# Patient Record
Sex: Female | Born: 1950
Health system: Southern US, Community
[De-identification: ages and names within clinical notes are randomized; demographics above are authoritative.]

## PROBLEM LIST (undated history)

## (undated) DIAGNOSIS — T7840XA Allergy, unspecified, initial encounter: Secondary | ICD-10-CM

## (undated) DIAGNOSIS — K219 Gastro-esophageal reflux disease without esophagitis: Secondary | ICD-10-CM

## (undated) DIAGNOSIS — I10 Essential (primary) hypertension: Secondary | ICD-10-CM

## (undated) DIAGNOSIS — E785 Hyperlipidemia, unspecified: Secondary | ICD-10-CM

## (undated) DIAGNOSIS — H269 Unspecified cataract: Secondary | ICD-10-CM

## (undated) DIAGNOSIS — E079 Disorder of thyroid, unspecified: Secondary | ICD-10-CM

## (undated) DIAGNOSIS — E559 Vitamin D deficiency, unspecified: Secondary | ICD-10-CM

## (undated) HISTORY — PX: SIGMOIDOSCOPY: SUR1295

## (undated) HISTORY — DX: Hyperlipidemia, unspecified: E78.5

## (undated) HISTORY — DX: Disorder of thyroid, unspecified: E07.9

## (undated) HISTORY — DX: Vitamin D deficiency, unspecified: E55.9

## (undated) HISTORY — PX: ABDOMINAL HYSTERECTOMY: SHX81

## (undated) HISTORY — DX: Gastro-esophageal reflux disease without esophagitis: K21.9

## (undated) HISTORY — DX: Unspecified cataract: H26.9

## (undated) HISTORY — PX: WISDOM TOOTH EXTRACTION: SHX21

## (undated) HISTORY — DX: Allergy, unspecified, initial encounter: T78.40XA

---

## 2008-02-27 ENCOUNTER — Encounter: Admission: RE | Admit: 2008-02-27 | Discharge: 2008-02-27 | Payer: Self-pay | Admitting: Internal Medicine

## 2008-03-14 ENCOUNTER — Encounter: Admission: RE | Admit: 2008-03-14 | Discharge: 2008-03-14 | Payer: Self-pay | Admitting: Internal Medicine

## 2008-05-02 ENCOUNTER — Encounter: Admission: RE | Admit: 2008-05-02 | Discharge: 2008-05-02 | Payer: Self-pay | Admitting: Internal Medicine

## 2009-03-05 ENCOUNTER — Encounter: Admission: RE | Admit: 2009-03-05 | Discharge: 2009-03-05 | Payer: Self-pay | Admitting: Internal Medicine

## 2010-04-07 ENCOUNTER — Encounter
Admission: RE | Admit: 2010-04-07 | Discharge: 2010-04-07 | Payer: Self-pay | Source: Home / Self Care | Attending: Internal Medicine | Admitting: Internal Medicine

## 2010-07-30 ENCOUNTER — Other Ambulatory Visit: Payer: Self-pay | Admitting: Internal Medicine

## 2010-07-30 DIAGNOSIS — N6322 Unspecified lump in the left breast, upper inner quadrant: Secondary | ICD-10-CM

## 2010-08-04 ENCOUNTER — Ambulatory Visit
Admission: RE | Admit: 2010-08-04 | Discharge: 2010-08-04 | Disposition: A | Discharge status: Home / Self Care | Payer: BC Managed Care – PPO | Source: Ambulatory Visit | Attending: Internal Medicine | Admitting: Internal Medicine

## 2010-08-04 ENCOUNTER — Other Ambulatory Visit: Payer: Self-pay | Admitting: Internal Medicine

## 2010-08-04 DIAGNOSIS — N6322 Unspecified lump in the left breast, upper inner quadrant: Secondary | ICD-10-CM

## 2011-03-24 ENCOUNTER — Other Ambulatory Visit: Payer: Self-pay | Admitting: Internal Medicine

## 2011-03-24 DIAGNOSIS — Z1231 Encounter for screening mammogram for malignant neoplasm of breast: Secondary | ICD-10-CM

## 2011-04-12 ENCOUNTER — Ambulatory Visit
Admission: RE | Admit: 2011-04-12 | Discharge: 2011-04-12 | Disposition: A | Discharge status: Home / Self Care | Payer: BC Managed Care – PPO | Source: Ambulatory Visit | Attending: Internal Medicine | Admitting: Internal Medicine

## 2011-04-12 DIAGNOSIS — Z1231 Encounter for screening mammogram for malignant neoplasm of breast: Secondary | ICD-10-CM

## 2012-03-07 ENCOUNTER — Other Ambulatory Visit: Payer: Self-pay | Admitting: Internal Medicine

## 2012-03-07 DIAGNOSIS — Z1231 Encounter for screening mammogram for malignant neoplasm of breast: Secondary | ICD-10-CM

## 2012-04-12 ENCOUNTER — Ambulatory Visit: Payer: BC Managed Care – PPO

## 2013-10-18 ENCOUNTER — Other Ambulatory Visit: Payer: Self-pay

## 2013-10-18 DIAGNOSIS — Z1231 Encounter for screening mammogram for malignant neoplasm of breast: Secondary | ICD-10-CM

## 2013-11-01 ENCOUNTER — Ambulatory Visit
Admission: RE | Admit: 2013-11-01 | Discharge: 2013-11-01 | Disposition: A | Discharge status: Home / Self Care | Payer: BC Managed Care – PPO | Source: Ambulatory Visit

## 2013-11-01 DIAGNOSIS — Z1231 Encounter for screening mammogram for malignant neoplasm of breast: Secondary | ICD-10-CM

## 2013-11-05 ENCOUNTER — Other Ambulatory Visit: Payer: Self-pay | Admitting: Nurse Practitioner

## 2013-11-05 DIAGNOSIS — R928 Other abnormal and inconclusive findings on diagnostic imaging of breast: Secondary | ICD-10-CM

## 2013-11-12 ENCOUNTER — Ambulatory Visit
Admission: RE | Admit: 2013-11-12 | Discharge: 2013-11-12 | Disposition: A | Discharge status: Home / Self Care | Payer: BC Managed Care – PPO | Source: Ambulatory Visit | Attending: Nurse Practitioner | Admitting: Nurse Practitioner

## 2013-11-12 DIAGNOSIS — R928 Other abnormal and inconclusive findings on diagnostic imaging of breast: Secondary | ICD-10-CM

## 2014-11-08 ENCOUNTER — Other Ambulatory Visit: Payer: Self-pay

## 2014-11-08 DIAGNOSIS — Z1231 Encounter for screening mammogram for malignant neoplasm of breast: Secondary | ICD-10-CM

## 2014-11-26 ENCOUNTER — Ambulatory Visit
Admission: RE | Admit: 2014-11-26 | Discharge: 2014-11-26 | Disposition: A | Discharge status: Home / Self Care | Payer: Medicare Other | Source: Ambulatory Visit

## 2014-11-26 DIAGNOSIS — Z1231 Encounter for screening mammogram for malignant neoplasm of breast: Secondary | ICD-10-CM

## 2015-06-09 DIAGNOSIS — R109 Unspecified abdominal pain: Secondary | ICD-10-CM | POA: Diagnosis not present

## 2015-06-09 DIAGNOSIS — N209 Urinary calculus, unspecified: Secondary | ICD-10-CM | POA: Diagnosis not present

## 2015-06-09 DIAGNOSIS — R319 Hematuria, unspecified: Secondary | ICD-10-CM | POA: Diagnosis not present

## 2015-06-09 DIAGNOSIS — N390 Urinary tract infection, site not specified: Secondary | ICD-10-CM | POA: Diagnosis not present

## 2015-06-24 DIAGNOSIS — K3189 Other diseases of stomach and duodenum: Secondary | ICD-10-CM | POA: Diagnosis not present

## 2015-06-24 DIAGNOSIS — E785 Hyperlipidemia, unspecified: Secondary | ICD-10-CM | POA: Diagnosis not present

## 2015-06-24 DIAGNOSIS — N390 Urinary tract infection, site not specified: Secondary | ICD-10-CM | POA: Diagnosis not present

## 2015-06-24 DIAGNOSIS — I10 Essential (primary) hypertension: Secondary | ICD-10-CM | POA: Diagnosis not present

## 2015-07-24 DIAGNOSIS — Z01419 Encounter for gynecological examination (general) (routine) without abnormal findings: Secondary | ICD-10-CM | POA: Diagnosis not present

## 2015-10-20 ENCOUNTER — Other Ambulatory Visit: Payer: Self-pay | Admitting: Specialist

## 2015-10-20 DIAGNOSIS — Z1231 Encounter for screening mammogram for malignant neoplasm of breast: Secondary | ICD-10-CM

## 2015-12-01 ENCOUNTER — Ambulatory Visit
Admission: RE | Admit: 2015-12-01 | Discharge: 2015-12-01 | Disposition: A | Discharge status: Home / Self Care | Payer: Medicare Other | Source: Ambulatory Visit | Attending: Specialist | Admitting: Specialist

## 2015-12-01 DIAGNOSIS — Z1231 Encounter for screening mammogram for malignant neoplasm of breast: Secondary | ICD-10-CM | POA: Diagnosis not present

## 2016-05-16 ENCOUNTER — Encounter (HOSPITAL_COMMUNITY): Payer: Self-pay | Admitting: Emergency Medicine

## 2016-05-16 ENCOUNTER — Ambulatory Visit (HOSPITAL_COMMUNITY)
Admission: EM | Admit: 2016-05-16 | Discharge: 2016-05-16 | Disposition: A | Discharge status: Home / Self Care | Payer: Medicare Other | Attending: Internal Medicine | Admitting: Internal Medicine

## 2016-05-16 DIAGNOSIS — K0889 Other specified disorders of teeth and supporting structures: Secondary | ICD-10-CM | POA: Diagnosis not present

## 2016-05-16 DIAGNOSIS — K047 Periapical abscess without sinus: Secondary | ICD-10-CM | POA: Diagnosis not present

## 2016-05-16 DIAGNOSIS — K029 Dental caries, unspecified: Secondary | ICD-10-CM

## 2016-05-16 HISTORY — DX: Essential (primary) hypertension: I10

## 2016-05-16 MED ORDER — HYDROCODONE-ACETAMINOPHEN 5-325 MG PO TABS: 1.0000 | ORAL_TABLET | ORAL | 0 refills | Status: DC | PRN

## 2016-05-16 MED ORDER — CLINDAMYCIN HCL 300 MG PO CAPS: 300.0000 mg | ORAL_CAPSULE | Freq: Three times a day (TID) | ORAL | 0 refills | Status: DC

## 2016-05-16 NOTE — ED Triage Notes (Signed)
The patient presented to the [facility] with a complaint of dental pain x 1 day. 

## 2016-05-16 NOTE — Discharge Instructions (Signed)
Take the medication as directed. Also recommend adding ibuprofen 400 mg every 6 hours to help with pain and inflammation. See your dentist as soon as possible.

## 2016-05-16 NOTE — ED Provider Notes (Signed)
CSN: [id number]     Arrival date & time 05/16/16  1248 History   First MD Initiated Contact with Patient 05/16/16 1454     Chief Complaint  Patient presents with  . Dental Pain   (Consider location/radiation/quality/duration/timing/severity/associated sxs/prior Treatment) 66 year old female complaining of toothache for 2 days. She states she went looking for a dentist yesterday and she believes that she found a dentist that may be able to give her appointment on Tuesday. She is complaining of pain over the right lower first back cuspid tooth. She had some Norco left over from previous episode of pain and she took 1 and it helped.      Past Medical History:  Diagnosis Date  . Hypertension    Past Surgical History:  Procedure Laterality Date  . ABDOMINAL HYSTERECTOMY    . WISDOM TOOTH EXTRACTION     History reviewed. No pertinent family history. Social History  Substance Use Topics  . Smoking status: Never Smoker  . Smokeless tobacco: Never Used  . Alcohol use Yes     Comment: Occ   OB History    No data available     Review of Systems  Constitutional: Negative.   HENT: Positive for dental problem. Negative for congestion and postnasal drip.   Respiratory: Negative.   Gastrointestinal: Negative.   Neurological: Negative.   All other systems reviewed and are negative.   Allergies  Penicillins  Home Medications   Prior to Admission medications   Medication Sig Start Date End Date Taking? Authorizing Provider  lisinopril (PRINIVIL,ZESTRIL) 40 MG tablet Take 40 mg by mouth daily.   Yes Historical Provider, MD  metoprolol succinate (TOPROL-XL) 25 MG 24 hr tablet Take 25 mg by mouth daily.   Yes Historical Provider, MD  triamterene-hydrochlorothiazide (MAXZIDE) 75-50 MG tablet Take 1 tablet by mouth daily.   Yes Historical Provider, MD  clindamycin (CLEOCIN) 300 MG capsule Take 1 capsule (300 mg total) by mouth 3 (three) times daily. 05/16/16   Hayden Rasmussen, NP   HYDROcodone-acetaminophen (NORCO/VICODIN) 5-325 MG tablet Take 1 tablet by mouth every 4 (four) hours as needed. 05/16/16   Hayden Rasmussen, NP   Meds Ordered and Administered this Visit  Medications - No data to display  BP 144/98 (BP Location: Right Arm)   Pulse 87   Temp 98.6 F (37 C) (Oral)   Resp 16   SpO2 100%  No data found.   Physical Exam  Constitutional: She is oriented to person, place, and time. She appears well-developed and well-nourished.  HENT:  Head: Normocephalic and atraumatic.  Mouth/Throat: Oropharynx is clear and moist.  The right lower first bicuspid is angulated, positive for Tenderness and surrounding gingival swelling and erythema. The teeth posterior to the bicuspid have either been pulled or rotted away. There is one second molar remaining.  Neck: Normal range of motion. Neck supple.  Cardiovascular: Normal rate.   Pulmonary/Chest: Effort normal.  Lymphadenopathy:    She has no cervical adenopathy.  Neurological: She is alert and oriented to person, place, and time.  Skin: Skin is warm and dry.  Nursing note and vitals reviewed.   Urgent Care Course     Procedures (including critical care time)  Labs Review Labs Reviewed - No data to display  Imaging Review No results found.   Visual Acuity Review  Right Eye Distance:   Left Eye Distance:   Bilateral Distance:    Right Eye Near:   Left Eye Near:    Bilateral Near:  MDM   1. Pain, dental   2. Toothache   3. Infected dental caries    CSRS reviewed. Take the medication as directed. Also recommend adding ibuprofen 400 mg every 6 hours to help with pain and inflammation. See your dentist as soon as possible. Meds ordered this encounter  Medications  . DISCONTD: HYDROcodone-acetaminophen (NORCO/VICODIN) 5-325 MG tablet    Sig: Take 1 tablet by mouth every 6 (six) hours as needed for moderate pain.  Marland Kitchen. triamterene-hydrochlorothiazide (MAXZIDE) 75-50 MG tablet    Sig: Take 1  tablet by mouth daily.  Marland Kitchen. lisinopril (PRINIVIL,ZESTRIL) 40 MG tablet    Sig: Take 40 mg by mouth daily.  . metoprolol succinate (TOPROL-XL) 25 MG 24 hr tablet    Sig: Take 25 mg by mouth daily.  Marland Kitchen. HYDROcodone-acetaminophen (NORCO/VICODIN) 5-325 MG tablet    Sig: Take 1 tablet by mouth every 4 (four) hours as needed.    Dispense:  15 tablet    Refill:  0    Order Specific Question:   Supervising Provider    Answer:   Eustace MooreMURRAY, LAURA W [[id number]]  . clindamycin (CLEOCIN) 300 MG capsule    Sig: Take 1 capsule (300 mg total) by mouth 3 (three) times daily.    Dispense:  21 capsule    Refill:  0    Order Specific Question:   Supervising Provider    Answer:   Eustace MooreMURRAY, LAURA W [[id number]]  Last 2 meds lidsted Rx'd here today.    Hayden Rasmussenavid Adylee Leonardo, NP 05/16/16 [phone]

## 2016-05-24 ENCOUNTER — Encounter: Payer: Self-pay | Admitting: Family Medicine

## 2016-05-24 ENCOUNTER — Ambulatory Visit (INDEPENDENT_AMBULATORY_CARE_PROVIDER_SITE_OTHER): Payer: Medicare Other | Admitting: Family Medicine

## 2016-05-24 VITALS — BP 140/90 | HR 76 | Resp 12 | Ht 61.5 in | Wt 174.5 lb

## 2016-05-24 DIAGNOSIS — E785 Hyperlipidemia, unspecified: Secondary | ICD-10-CM | POA: Diagnosis not present

## 2016-05-24 DIAGNOSIS — Z6832 Body mass index (BMI) 32.0-32.9, adult: Secondary | ICD-10-CM

## 2016-05-24 DIAGNOSIS — Z6831 Body mass index (BMI) 31.0-31.9, adult: Secondary | ICD-10-CM

## 2016-05-24 DIAGNOSIS — R51 Headache: Secondary | ICD-10-CM

## 2016-05-24 DIAGNOSIS — R7301 Impaired fasting glucose: Secondary | ICD-10-CM | POA: Diagnosis not present

## 2016-05-24 DIAGNOSIS — L538 Other specified erythematous conditions: Secondary | ICD-10-CM | POA: Diagnosis not present

## 2016-05-24 DIAGNOSIS — R519 Headache, unspecified: Secondary | ICD-10-CM

## 2016-05-24 DIAGNOSIS — I10 Essential (primary) hypertension: Secondary | ICD-10-CM | POA: Diagnosis not present

## 2016-05-24 DIAGNOSIS — E669 Obesity, unspecified: Secondary | ICD-10-CM | POA: Insufficient documentation

## 2016-05-24 LAB — BASIC METABOLIC PANEL
BUN: 12 mg/dL (ref 6–23)
CHLORIDE: 100 meq/L (ref 96–112)
CO2: 30 meq/L (ref 19–32)
CREATININE: 0.95 mg/dL (ref 0.40–1.20)
Calcium: 9.7 mg/dL (ref 8.4–10.5)
GFR: 75.38 mL/min (ref >60.00)
GLUCOSE: 82 mg/dL (ref 70–99)
POTASSIUM: 4.1 meq/L (ref 3.5–5.1)
Sodium: 136 mEq/L (ref 135–145)

## 2016-05-24 LAB — TSH: TSH: 1.14 u[IU]/mL (ref 0.35–4.50)

## 2016-05-24 LAB — HEMOGLOBIN A1C: HEMOGLOBIN A1C: 6.1 % (ref 4.6–6.5)

## 2016-05-24 MED ORDER — TRIAMCINOLONE ACETONIDE 0.5 % EX OINT: 1.0000 "application " | TOPICAL_OINTMENT | Freq: Two times a day (BID) | CUTANEOUS | 0 refills | Status: AC

## 2016-05-24 MED ORDER — ATORVASTATIN CALCIUM 40 MG PO TABS: 40.0000 mg | ORAL_TABLET | Freq: Every day | ORAL | 1 refills | Status: DC

## 2016-05-24 NOTE — Progress Notes (Signed)
HPI:   [name] is a 66 y.o. female, who is here today to establish care with me.  Former PCP: Dr Allyne Gee. Last preventive routine visit: 2013  Chronic medical problems: HTN,HLD   Hypertension:   Dx with HTN in her late 45's or early 35's. Currently on Lisinopril 40 mg, Maxzide  75-50 mg daily, and Metoprolol XL 25 mg daily. She does not check BP at home. + Stress She is taking medications as instructed, no side effects reported.  She has not noted  visual changes, exertional chest pain, dyspnea,  focal weakness, or edema.    Concerns today: Rash,headache,"circulation problems"  -Headache: Reporting Hx of headaches years ago, has not had headaches in a few years. She is concerned because for the past couple months she has has sharp episodes of right parieto-occipital headache, lasts a few seconds. No associated visual changes, nausea,or vomiting. She has not identified exacerbating or alleviating factors.Last episode was yesterday.    -Skin rash:  3 days ago she noted erythematosa rash on left elbow flexure, has extended, tender upon wearing tight sleeves. She thought it may be related to new medication she received recently in the ER to treat toothache: Clindamycin and Hydrocodone-Acetaminophen.She discontinued Clindamycin when she first noted lesion but lesion continue to spread. Initially lesion was pruritic. She denies trauma.  She denies new detergent, soap, or body product. No known insect bite or outdoor exposures to plants. No sick contact. No Hx of eczema or similar rash in the past.  OTC medication for this problem: Olive vera oil.  No associated arthralgia, oral lesions, tingling or numbness.  Dental pain has improved,she has not arranged appt with dentists, she is looking por a new one.   -Numbness left lower extremity just when sitting for long time at work. Reporting history of "sciatic pain", has not had it for years. She denies Hx of   Diabetes but tells me that she was on Metformin, which lower her blood pressure, so discontinued. She attributes elevated glucose to medication she was taken ,prescribed by dermatologists to help with ingrown hair on face.  She does not exercise regularly and doe snot follow a healthy diet consistently.  HLD, she ran out of Atorvastatin 40 mg a few weeks ago. Tolerating well, reporting no side effects.    Review of Systems  Constitutional: Positive for fatigue. Negative for activity change, appetite change, fever and unexpected weight change.  HENT: Positive for dental problem. Negative for mouth sores, nosebleeds, sore throat and trouble swallowing.   Eyes: Negative for redness and visual disturbance.  Respiratory: Negative for cough, shortness of breath and wheezing.   Cardiovascular: Negative for chest pain, palpitations and leg swelling.  Gastrointestinal: Negative for abdominal pain, nausea and vomiting.       Negative for changes in bowel habits.  Endocrine: Negative for polydipsia, polyphagia and polyuria.  Genitourinary: Negative for decreased urine volume and hematuria.  Musculoskeletal: Negative for gait problem, myalgias and neck pain.  Skin: Positive for rash. Negative for wound.  Neurological: Positive for numbness and headaches. Negative for syncope, facial asymmetry and weakness.  Hematological: Negative for adenopathy. Does not bruise/bleed easily.  Psychiatric/Behavioral: Negative for confusion. The patient is nervous/anxious.       Current Outpatient Prescriptions on File Prior to Visit  Medication Sig Dispense Refill  . HYDROcodone-acetaminophen (NORCO/VICODIN) 5-325 MG tablet Take 1 tablet by mouth every 4 (four) hours as needed. 15 tablet 0  . lisinopril (PRINIVIL,ZESTRIL) 40 MG tablet  Take 40 mg by mouth daily.    . metoprolol succinate (TOPROL-XL) 25 MG 24 hr tablet Take 25 mg by mouth daily.    Marland Kitchen triamterene-hydrochlorothiazide (MAXZIDE) 75-50 MG tablet Take  1 tablet by mouth daily.     No current facility-administered medications on file prior to visit.      Past Medical History:  Diagnosis Date  . Allergy   . GERD (gastroesophageal reflux disease)   . Hyperlipidemia   . Hypertension   . Vitamin D deficiency    Allergies  Allergen Reactions  . Penicillins Hives    Family History  Problem Relation Age of Onset  . Stroke Mother   . Hypertension Mother   . Stroke Brother   . Hypertension Maternal Grandmother   . Hypertension Maternal Grandfather   . Alcohol abuse Brother   . Hypertension Brother   . Stroke Brother     Social History   Social History  . Marital status: Single    Spouse name: N/A  . Number of children: N/A  . Years of education: N/A   Social History Main Topics  . Smoking status: Never Smoker  . Smokeless tobacco: Never Used  . Alcohol use Yes     Comment: Occ  . Drug use: No  . Sexual activity: Not Asked   Other Topics Concern  . None   Social History Narrative  . None    Vitals:   05/24/16 1030  BP: 140/90  Pulse: 76  Resp: 12   O2 sat at RA 98%. Body mass index is 32.44 kg/m.   Physical Exam  Nursing note and vitals reviewed. Constitutional: She is oriented to person, place, and time. She appears well-developed. No distress.  HENT:  Head: Atraumatic.  Mouth/Throat: Uvula is midline, oropharynx is clear and moist and mucous membranes are normal.  Eyes: Conjunctivae and EOM are normal. Pupils are equal, round, and reactive to light.  Neck: No tracheal deviation present. Thyromegaly (mild, ? multinodular goiter) present.  Cardiovascular: Normal rate and regular rhythm.   No murmur heard. Pulses:      Dorsalis pedis pulses are 2+ on the right side, and 2+ on the left side.  varicose veins R>L.  Respiratory: Effort normal and breath sounds normal. No respiratory distress.  GI: Soft. She exhibits no mass. There is no hepatomegaly. There is no tenderness.  Musculoskeletal: She  exhibits no edema or tenderness.       Cervical back: She exhibits no tenderness.  Lymphadenopathy:    She has no cervical adenopathy.  Neurological: She is alert and oriented to person, place, and time. She has normal strength. Coordination and gait normal.  Skin: Skin is warm. Rash noted. No ecchymosis noted. Rash is macular. No erythema.     Macular, dry lesion on left elbow anterior aspect.Lesion spreads from distal arm to proximal aspect of forearm. It is not tender, mild local heat,no induration. About 13-15 cm.  Psychiatric: Her mood appears anxious.  Well groomed, good eye contact.      ASSESSMENT AND PLAN:   Latasha Young was seen today for establish care.  Diagnoses and all orders for this visit:  Lab Results  Component Value Date   TSH 1.14 05/24/2016   Lab Results  Component Value Date   HGBA1C 6.1 05/24/2016   Lab Results  Component Value Date   CREATININE 0.95 05/24/2016   BUN 12 05/24/2016   NA 136 05/24/2016   K 4.1 05/24/2016   CL 100 05/24/2016  CO2 30 05/24/2016    IFG (impaired fasting glucose)   ? DM II. Further recommendations will be given according to lab results. Prevention through a healthier life style recommended.  -     Basic metabolic panel -     Hemoglobin A1c  Hypertension, essential, benign  Slightly elavated. No changes in current management for now. DASH-low salt diet recommended. Eye exam recommended annually. F/U in 3 months, before if needed.  -     Basic metabolic panel -     TSH  Macular erythematous rash  Lesion seems eczematic. Topical steroid recommended, some side effects discussed. We discussed options in regard to oral abx, she wonders if she can complete abx prescribed recently for dental abscess/pain. Rash did not improve with medication discontinuation, so she can resume, some side effects discussed. F/U as needed. If not better dermatology evaluation should be considered.  -     Hemoglobin A1c -      triamcinolone ointment (KENALOG) 0.5 %; Apply 1 application topically 2 (two) times daily.  Hyperlipidemia, unspecified hyperlipidemia type  She is not fasting today, resume Lipitor. Low fat diet. F/U in 3 months.  -     atorvastatin (LIPITOR) 40 MG tablet; Take 1 tablet (40 mg total) by mouth daily.  BMI 32.0-32.9,adult  We discussed benefits of wt loss as well as adverse effects of obesity. Consistency with healthy diet and physical activity recommended. Daily brisk walking for 15-30 min as tolerated.  Headache, unspecified headache type Headache does not seem to be serious. ? Related to toothache,tension,medications among some. Adequate fluid hydration.  Will continue following. Instructed about warning signs.     I-n regard to enlarged thyroid, she states that she has been told in the past that her thyroid is mildly enlarged.I will hold on further work-up until TSH is back, will try to obtain records from former PCP.      Jai Bear G. [place]az[place], MD  [facility]. [facility].

## 2016-05-24 NOTE — Progress Notes (Signed)
Pre visit review using our clinic review tool, if applicable. No additional management support is needed unless otherwise documented below in the visit note. 

## 2016-05-24 NOTE — Patient Instructions (Addendum)
A few things to remember from today's visit:   IFG (impaired fasting glucose)  Hypertension, essential, benign - Plan: Basic metabolic panel  Macular erythematous rash - Plan: Hemoglobin A1c, triamcinolone ointment (KENALOG) 0.5 %  Hyperlipidemia, unspecified hyperlipidemia type - Plan: atorvastatin (LIPITOR) 40 MG tablet   No changes today.  Please schedule dentist appointment. Resume Clindamycin. Please follow with dermatologists if rash is not better. Start Probiotic over the counter.  Blood pressure goal for most people is less than 140/90. Some populations (older than 60) the goal is less than 150/90.  Most recent cardiologists' recommendations recommend blood pressure at or less than 130/80.   Elevated blood pressure increases the risk of strokes, heart and kidney disease, and eye problems. Regular physical activity and a healthy diet (DASH diet) usually help. Low salt diet. Take medications as instructed.  Caution with some over the counter medications as cold medications, dietary products (for weight loss), and Ibuprofen or Aleve (frequent use);all these medications could cause elevation of blood pressure.   Please be sure medication list is accurate. If a new problem present, please set up appointment sooner than planned today.

## 2016-07-16 DIAGNOSIS — H52223 Regular astigmatism, bilateral: Secondary | ICD-10-CM | POA: Diagnosis not present

## 2016-07-30 ENCOUNTER — Encounter: Payer: Self-pay | Admitting: Family Medicine

## 2016-07-30 ENCOUNTER — Ambulatory Visit (INDEPENDENT_AMBULATORY_CARE_PROVIDER_SITE_OTHER): Payer: Medicare Other | Admitting: Family Medicine

## 2016-07-30 VITALS — BP 132/94 | HR 88 | Temp 98.2°F | Wt 171.6 lb

## 2016-07-30 DIAGNOSIS — R0600 Dyspnea, unspecified: Secondary | ICD-10-CM

## 2016-07-30 DIAGNOSIS — J309 Allergic rhinitis, unspecified: Secondary | ICD-10-CM | POA: Diagnosis not present

## 2016-07-30 DIAGNOSIS — R0789 Other chest pain: Secondary | ICD-10-CM | POA: Diagnosis not present

## 2016-07-30 DIAGNOSIS — I10 Essential (primary) hypertension: Secondary | ICD-10-CM

## 2016-07-30 DIAGNOSIS — K219 Gastro-esophageal reflux disease without esophagitis: Secondary | ICD-10-CM | POA: Diagnosis not present

## 2016-07-30 LAB — BASIC METABOLIC PANEL
BUN: 13 mg/dL (ref 6–23)
CHLORIDE: 97 meq/L (ref 96–112)
CO2: 31 mEq/L (ref 19–32)
Calcium: 10 mg/dL (ref 8.4–10.5)
Creatinine, Ser: 0.96 mg/dL (ref 0.40–1.20)
GFR: 74.43 mL/min (ref >60.00)
GLUCOSE: 84 mg/dL (ref 70–99)
POTASSIUM: 4.1 meq/L (ref 3.5–5.1)
SODIUM: 135 meq/L (ref 135–145)

## 2016-07-30 MED ORDER — METOPROLOL SUCCINATE ER 25 MG PO TB24: 25.0000 mg | ORAL_TABLET | Freq: Every day | ORAL | 2 refills | Status: DC

## 2016-07-30 MED ORDER — OMEPRAZOLE 40 MG PO CPDR: 40.0000 mg | DELAYED_RELEASE_CAPSULE | Freq: Every day | ORAL | 3 refills | Status: DC

## 2016-07-30 MED ORDER — TRIAMTERENE-HCTZ 37.5-25 MG PO TABS: 1.0000 | ORAL_TABLET | Freq: Every day | ORAL | 3 refills | Status: DC

## 2016-07-30 MED ORDER — FLUTICASONE PROPIONATE 50 MCG/ACT NA SUSP: 1.0000 | Freq: Two times a day (BID) | NASAL | 3 refills | Status: DC

## 2016-07-30 NOTE — Progress Notes (Signed)
Pre visit review using our clinic review tool, if applicable. No additional management support is needed unless otherwise documented below in the visit note. 

## 2016-07-30 NOTE — Patient Instructions (Addendum)
WE NOW OFFER   [facility] FAST TRACK!!!  SAME DAY Appointments for ACUTE CARE  Such as: Sprains, Injuries, cuts, abrasions, rashes, muscle pain, joint pain, back pain Colds, flu, sore throats, headache, allergies, cough, fever  Ear pain, sinus and eye infections Abdominal pain, nausea, vomiting, diarrhea, upset stomach Animal/insect bites  3 Easy Ways to Schedule: Walk-In Scheduling Call in scheduling Mychart Sign-up: [email or web address]   A few things to remember from today's visit:   Chest discomfort - Plan: DG Chest 2 View, EKG 12-Lead  Gastroesophageal reflux disease, esophagitis presence not specified  Hypertension, essential, benign  Dyspnea, unspecified type  Allergic rhinitis, unspecified seasonality, unspecified trigger  Maxzide decreased. Omeprazole 40 mg daily. Resume Metoprolol.   Today X ray was ordered.  This can be done at [facility] at [facility] between 8 am and 5 pm: [address] [place]. [phone].     Avoid foods that make your symptoms worse, for example coffee, chocolate,pepermeint,alcohol, and greasy food. Raising the head of your bed about 6 inches may help with nocturnal symptoms.  Avoid tobacco use. Weight loss (if you are overweight). Avoid lying down for 3 hours after eating.  Instead 3 large meals daily try small and more frequent meals during the day.    You should be evaluated immediately if bloody vomiting, bloody stools, black stools (like tar), difficulty swallowing, food gets stuck on the way down or choking when eating. Abnormal weight loss or severe abdominal pain.  If symptoms are not resolved sometimes endoscopy is necessary.   Please be sure medication list is accurate. If a new problem present, please set up appointment sooner than planned today.

## 2016-07-30 NOTE — Progress Notes (Signed)
HPI:   ACUTE VISIT:  Chief Complaint  Patient presents with  . Fatigue    [name] is a 66 y.o. female, who is here today complaining of "breathing issues."  For the past 3 days she has felt intermittent tightness sensation on middle of her chest and SOB. She has a hard time explaining symptoms, she tells me that she has the "need to catch my breath", feeling like she does not get enough air in her lungs,afraid of "fainting." She denies syncope but has had pre syncopal episodes in the past.  Chest tightness is not radiated. Symptoms are exacerbated by mild-moderate exertion: Chores around her house,climbing stairs,or walking. Alleviated by resting for a few minutes. She denies palpitations, diaphoresis,or dizziness. + "Little" cough, denies wheezing.  Symptoms seems worse in the morning, at night she feels better.  Hx of HTN, prediabetes, and HLD.   Lab Results  Component Value Date   CREATININE 0.95 05/24/2016   BUN 12 05/24/2016   NA 136 05/24/2016   K 4.1 05/24/2016   CL 100 05/24/2016   CO2 30 05/24/2016   She tells me that she has followed with cardiologists until 2 years ago, she was released 06/2014 She had stress test,EKG's,and labs, "every thing was normal." At this time she was having similar symptoms but worse.  + Stress, states that some her friends have died from heart disease. + Fatigue, she feels rested when she first wakes up in the morning but has a hard time getting out of bed, feels "slow" in the morning,sluggish. She has not taken naps as she usually does in the past few days. Denies insomnia but sometimes she goes to bed late. Denies Hx of OSA.  Hx of GERD, she takes Omeprazole 20 mg as needed. Acidic sensation in upper chest, feels like food in her chest, denies dysphagia. She is not sure about type of food that exacerbate symptoms, Omeprazole helps some.  Nasal congestion, rhinorrhea, itchy nose and eyes.She has Hx of allergic  rhinitis,she is not taking treatment. No fever,chills,or sick contact. She takes Tylenol sinus occasionally.   HTN: She has not taken Metoprolol XR 25 mg for about 2-3 weeks. She is still on Maxzide 75-50 mg daily and Lisinopril 40 mg daily.  Denies severe/frequent headache, visual changes, claudication, focal weakness, or edema. She tries to follow a healthy diet, low salt.    Review of Systems  Constitutional: Positive for fatigue. Negative for activity change, appetite change, diaphoresis, fever and unexpected weight change.  HENT: Positive for congestion, rhinorrhea and trouble swallowing. Negative for facial swelling, mouth sores, nosebleeds, sore throat and voice change.   Eyes: Positive for itching. Negative for redness and visual disturbance.  Respiratory: Positive for chest tightness and shortness of breath. Negative for cough, choking and wheezing.   Cardiovascular: Negative for palpitations and leg swelling.  Gastrointestinal: Negative for abdominal pain, nausea and vomiting.       Negative for changes in bowel habits.  Endocrine: Negative for cold intolerance, heat intolerance, polydipsia, polyphagia and polyuria.  Genitourinary: Negative for decreased urine volume, dysuria and hematuria.  Musculoskeletal: Negative for gait problem and myalgias.  Skin: Negative for pallor and rash.  Allergic/Immunologic: Positive for environmental allergies.  Neurological: Negative for dizziness, syncope, facial asymmetry, weakness and headaches.  Hematological: Negative for adenopathy. Does not bruise/bleed easily.  Psychiatric/Behavioral: Negative for confusion. The patient is nervous/anxious.      Current Outpatient Prescriptions on File Prior to Visit  Medication Sig  Dispense Refill  . atorvastatin (LIPITOR) 40 MG tablet Take 1 tablet (40 mg total) by mouth daily. 90 tablet 1  . lisinopril (PRINIVIL,ZESTRIL) 40 MG tablet Take 40 mg by mouth daily.     No current  facility-administered medications on file prior to visit.      Past Medical History:  Diagnosis Date  . Allergy   . GERD (gastroesophageal reflux disease)   . Hyperlipidemia   . Hypertension   . Vitamin D deficiency    Allergies  Allergen Reactions  . Penicillins Hives    Social History   Social History  . Marital status: Divorced    Spouse name: N/A  . Number of children: N/A  . Years of education: N/A   Social History Main Topics  . Smoking status: Never Smoker  . Smokeless tobacco: Never Used  . Alcohol use Yes     Comment: Occ  . Drug use: No  . Sexual activity: Not Asked   Other Topics Concern  . None   Social History Narrative  . None    Family History  Problem Relation Age of Onset  . Stroke Mother   . Hypertension Mother   . Stroke Brother   . Hypertension Maternal Grandmother   . Hypertension Maternal Grandfather   . Alcohol abuse Brother   . Hypertension Brother   . Stroke Brother      Vitals:   07/30/16 1136  BP: (!) 132/94  Pulse: 88  Temp: 98.2 F (36.8 C)  O2 sat 98% at RA. Body mass index is 31.9 kg/m.   Physical Exam  Nursing note and vitals reviewed. Constitutional: She is oriented to person, place, and time. She appears well-developed. She does not appear ill. No distress.  HENT:  Head: Atraumatic.  Mouth/Throat: Oropharynx is clear and moist and mucous membranes are normal.  Enlarged turbinates.  Eyes: Conjunctivae and EOM are normal. Pupils are equal, round, and reactive to light.  Neck: No tracheal deviation present. No thyroid mass and no thyromegaly (palpable.) present.  Cardiovascular: Normal rate and regular rhythm.   No murmur heard. Pulses:      Dorsalis pedis pulses are 2+ on the right side, and 2+ on the left side.  Respiratory: Effort normal and breath sounds normal. No respiratory distress. She exhibits no tenderness.  GI: Soft. She exhibits no mass. There is no hepatomegaly. There is no tenderness.    Musculoskeletal: She exhibits no edema or tenderness.  Lymphadenopathy:    She has no cervical adenopathy.       Right: No supraclavicular adenopathy present.       Left: No supraclavicular adenopathy present.  Neurological: She is alert and oriented to person, place, and time. She has normal strength. No cranial nerve deficit. Gait normal.  Skin: Skin is warm. No erythema.  Psychiatric: Her mood appears anxious.  Fairly groomed, good eye contact.    ASSESSMENT AND PLAN:   Sanah was seen today for fatigue.  Diagnoses and all orders for this visit:  Chest discomfort  We discussed possible etiologies including cardiac,pulmonary,GI among some. Reporting negative stress test about 2 years ago. EKG SR,normal axis,? LAE,  today unspecific T waves ant lateral.No other EKG for comparison. Clearly instructed about warning signs. F/U in 3-4 weeks.  -     DG Chest 2 View; Future -     EKG 12-Lead  Dyspnea, unspecified type  ? Deconditioning  allergies,cardiac, anxiety among some to consider. Lung/heart auscultation normal otherwise. Further recommendations will be  given according to labs/imaging results. Instructed about warning signs.   -     DG Chest 2 View; Future -     Basic metabolic panel  Gastroesophageal reflux disease, esophagitis presence not specified  Not well controlled. Omeprazole increased from 20 mg to 40 mg. GERD precautions discussed. F/U in 3-4 weeks.  -     omeprazole (PRILOSEC) 40 MG capsule; Take 1 capsule (40 mg total) by mouth daily before breakfast.  Hypertension, essential, benign  Not well controlled. Possible complications of elevated BP discussed. Resume Metoprolol Succinate 25 mg, decrease Maxzide to 37.5-25 mg, no changes in Lisinopril. Side effects of meds discussed. Low salt diet. F/U in 3-4 weeks.  -     metoprolol succinate (TOPROL-XL) 25 MG 24 hr tablet; Take 1 tablet (25 mg total) by mouth daily. -      triamterene-hydrochlorothiazide (MAXZIDE-25) 37.5-25 MG tablet; Take 1 tablet by mouth daily. -     Basic metabolic panel  Allergic rhinitis, unspecified seasonality, unspecified trigger  Can contribute to feeling SOB. OTC Allegra 180 mg, nasal irrigations with saline, and Flonase nasal spray recommended. Avoid decongestants.  -     fluticasone (FLONASE) 50 MCG/ACT nasal spray; Place 1 spray into both nostrils 2 (two) times daily.   Return for [facility] next appt.  Face to face OV: 11:47 am-12:18 pm 12:48-12:58 pm. 41 min face to face OV. > 50% was dedicated to discussion of differential Dx, complications risks,treatment options,some side effects of pharmacologic treatments, and coordination of care.     -[name] was advised to return or notify a doctor immediately if symptoms worsen or new concerns arise.       Latasha Arnott G. [place], MD  [facility]. [place] office.

## 2016-08-23 ENCOUNTER — Encounter: Payer: Self-pay | Admitting: Family Medicine

## 2016-08-23 ENCOUNTER — Ambulatory Visit (INDEPENDENT_AMBULATORY_CARE_PROVIDER_SITE_OTHER): Payer: Medicare Other | Admitting: Family Medicine

## 2016-08-23 VITALS — BP 140/96 | HR 78 | Resp 12 | Ht 61.5 in | Wt 172.2 lb

## 2016-08-23 DIAGNOSIS — Z6832 Body mass index (BMI) 32.0-32.9, adult: Secondary | ICD-10-CM

## 2016-08-23 DIAGNOSIS — K219 Gastro-esophageal reflux disease without esophagitis: Secondary | ICD-10-CM | POA: Diagnosis not present

## 2016-08-23 DIAGNOSIS — J309 Allergic rhinitis, unspecified: Secondary | ICD-10-CM | POA: Diagnosis not present

## 2016-08-23 DIAGNOSIS — E049 Nontoxic goiter, unspecified: Secondary | ICD-10-CM | POA: Diagnosis not present

## 2016-08-23 DIAGNOSIS — E785 Hyperlipidemia, unspecified: Secondary | ICD-10-CM | POA: Diagnosis not present

## 2016-08-23 DIAGNOSIS — I10 Essential (primary) hypertension: Secondary | ICD-10-CM | POA: Diagnosis not present

## 2016-08-23 LAB — COMPREHENSIVE METABOLIC PANEL
ALT: 20 U/L (ref 0–35)
AST: 17 U/L (ref 0–37)
Albumin: 4.4 g/dL (ref 3.5–5.2)
Alkaline Phosphatase: 88 U/L (ref 39–117)
BUN: 10 mg/dL (ref 6–23)
CALCIUM: 9.5 mg/dL (ref 8.4–10.5)
CHLORIDE: 99 meq/L (ref 96–112)
CO2: 31 meq/L (ref 19–32)
CREATININE: 0.99 mg/dL (ref 0.40–1.20)
GFR: 71.82 mL/min (ref >60.00)
Glucose, Bld: 109 mg/dL — ABNORMAL HIGH (ref 70–99)
Potassium: 3.6 mEq/L (ref 3.5–5.1)
SODIUM: 136 meq/L (ref 135–145)
Total Bilirubin: 1.6 mg/dL — ABNORMAL HIGH (ref 0.2–1.2)
Total Protein: 7.1 g/dL (ref 6.0–8.3)

## 2016-08-23 LAB — LIPID PANEL
CHOLESTEROL: 214 mg/dL — AB (ref 0–200)
HDL: 35.1 mg/dL — AB (ref >39.00)
LDL CALC: 145 mg/dL — AB (ref 0–99)
NonHDL: 179.22
Total CHOL/HDL Ratio: 6
Triglycerides: 170 mg/dL — ABNORMAL HIGH (ref 0.0–149.0)
VLDL: 34 mg/dL (ref 0.0–40.0)

## 2016-08-23 LAB — HEMOGLOBIN A1C: Hgb A1c MFr Bld: 6.3 % (ref 4.6–6.5)

## 2016-08-23 MED ORDER — LISINOPRIL 40 MG PO TABS: 40.0000 mg | ORAL_TABLET | Freq: Every day | ORAL | 1 refills | Status: DC

## 2016-08-23 MED ORDER — FLUTICASONE PROPIONATE 50 MCG/ACT NA SUSP: 1.0000 | Freq: Two times a day (BID) | NASAL | 6 refills | Status: DC

## 2016-08-23 NOTE — Patient Instructions (Signed)
A few things to remember from today's visit:   Gastroesophageal reflux disease, esophagitis presence not specified  Hypertension, essential, benign - Plan: Comprehensive metabolic panel  Hyperlipidemia, unspecified hyperlipidemia type - Plan: Comprehensive metabolic panel, Lipid panel  BMI 32.0-32.9,adult - Plan: Hemoglobin A1c  Allergic rhinitis, unspecified seasonality, unspecified trigger  Enlarged thyroid gland - Plan: US THYROID  Blood pressure goal for most people is less than 140/90. Some populations (older than 60) the goal is less than 150/90.  Most recent cardiologists' recommendations recommend blood pressure at or less than 130/80.   Elevated blood pressure increases the risk of strokes, heart and kidney disease, and eye problems. Regular physical activity and a healthy diet (DASH diet) usually help. Low salt diet. Take medications as instructed.  Caution with some over the counter medications as cold medications, dietary products (for weight loss), and Ibuprofen or Aleve (frequent use);all these medications could cause elevation of blood pressure.  Let me know when you are back so we can arranged colonoscopy.   Please be sure medication list is accurate. If a new problem present, please set up appointment sooner than planned today.

## 2016-08-23 NOTE — Progress Notes (Signed)
HPI:   Latasha Young is a 66 y.o. female, who is here today to follow on some chronic medical problems.  Hypertension:   She is supposed to be on Lisinopril 40 mg, Metoprolol Succinate 25 mg daily, and Maxzide 37.5-25 mg daily.  She has not been taking Lisinopril, she is not sure  When she stopped taking it. BP's readings has been "fine" until yesterday 145/90, today 142/98.  She has trying to follow a low salt diet but has not been consistent. Last eye exam in 06/2016, normal otherwise, small cataract was seen.  No medications side effects reported.  She has not noted unusual headache, visual changes, exertional chest pain, dyspnea,  focal weakness, or edema.   Lab Results  Component Value Date   CREATININE 0.96 07/30/2016   BUN 13 07/30/2016   NA 135 07/30/2016   K 4.1 07/30/2016   CL 97 07/30/2016   CO2 31 07/30/2016    GERD: Omeprazole helped with heartburn and she is not longer having chest pain. She is now taking Omeprazole 40 mg as needed and has not had symptoms in a while. She tries to avoid foods that can aggravate symptoms.   She is walking "some" and trying to eat better.  Denies abdominal pain, nausea, vomiting, changes in bowel habits, blood in stool or melena.  Allergic rhinitis: Headache resolves after using Flonase nasal spray. Still "stuffy" nose but greatly improved. She has taken OTC antihistaminic, which she has not needed lately.   Hyperlipidemia:  Currently on Lipitor 40 mg daily. Following a low fat diet: Yes.  She has not noted side effects with medication.    Review of Systems  Constitutional: Negative for activity change, appetite change, fatigue, fever and unexpected weight change.  HENT: Negative for mouth sores, nosebleeds and trouble swallowing.   Eyes: Negative for redness and visual disturbance.  Respiratory: Negative for cough, shortness of breath and wheezing.   Cardiovascular: Negative for chest pain,  palpitations and leg swelling.  Gastrointestinal: Negative for abdominal pain, nausea and vomiting.       Negative for changes in bowel habits.  Genitourinary: Negative for decreased urine volume, difficulty urinating, dysuria and hematuria.  Neurological: Negative for seizures, syncope, weakness, numbness and headaches.  Psychiatric/Behavioral: Negative.       Current Outpatient Prescriptions on File Prior to Visit  Medication Sig Dispense Refill  . atorvastatin (LIPITOR) 40 MG tablet Take 1 tablet (40 mg total) by mouth daily. 90 tablet 1  . metoprolol succinate (TOPROL-XL) 25 MG 24 hr tablet Take 1 tablet (25 mg total) by mouth daily. 30 tablet 2  . omeprazole (PRILOSEC) 40 MG capsule Take 1 capsule (40 mg total) by mouth daily before breakfast. 30 capsule 3  . triamterene-hydrochlorothiazide (MAXZIDE-25) 37.5-25 MG tablet Take 1 tablet by mouth daily. 90 tablet 3   No current facility-administered medications on file prior to visit.      Past Medical History:  Diagnosis Date  . Allergy   . GERD (gastroesophageal reflux disease)   . Hyperlipidemia   . Hypertension   . Vitamin D deficiency    Allergies  Allergen Reactions  . Penicillins Hives    Social History   Social History  . Marital status: Divorced    Spouse name: N/A  . Number of children: N/A  . Years of education: N/A   Social History Main Topics  . Smoking status: Never Smoker  . Smokeless tobacco: Never Used  . Alcohol use Yes  Comment: Occ  . Drug use: No  . Sexual activity: Not Asked   Other Topics Concern  . None   Social History Narrative  . None    Vitals:   08/23/16 0852  BP: (!) 140/96  Pulse: 78  Resp: 12   Body mass index is 32.02 kg/m.  Wt Readings from Last 3 Encounters:  08/23/16 172 lb 4 oz (78.1 kg)  07/30/16 171 lb 9.6 oz (77.8 kg)  05/24/16 174 lb 8 oz (79.2 kg)    Physical Exam  Nursing note and vitals reviewed. Constitutional: She is oriented to person, place,  and time. She appears well-developed. No distress.  HENT:  Head: Atraumatic.  Mouth/Throat: Oropharynx is clear and moist and mucous membranes are normal.  Eyes: Conjunctivae and EOM are normal. Pupils are equal, round, and reactive to light.  Neck: No tracheal deviation present. Thyromegaly (R>L) present.  Cardiovascular: Normal rate and regular rhythm.   No murmur heard. Pulses:      Dorsalis pedis pulses are 2+ on the right side, and 2+ on the left side.  Respiratory: Effort normal and breath sounds normal. No respiratory distress.  GI: Soft. She exhibits no mass. There is no hepatomegaly. There is no tenderness.  Musculoskeletal: She exhibits no edema or tenderness.  Lymphadenopathy:    She has no cervical adenopathy.  Neurological: She is alert and oriented to person, place, and time. She has normal strength. Gait normal.  Skin: Skin is warm. No erythema.  Psychiatric: Her mood appears anxious.  Well groomed, good eye contact.     ASSESSMENT AND PLAN:    [place] was seen today for follow-up.  Diagnoses and all orders for this visit:  Gastroesophageal reflux disease, esophagitis presence not specified  Well controlled. Continue GERD precautions. No changes in current management. F/U in 12 months.  Hypertension, essential, benign  Not well controlled. Possible complications of elevated BP discussed. Resume Lisinopril and no changes in rest of medications. Continue monitoring BP at home and f/u in 3-4 months.  -     Comprehensive metabolic panel -     lisinopril (PRINIVIL,ZESTRIL) 40 MG tablet; Take 1 tablet (40 mg total) by mouth daily.  Hyperlipidemia, unspecified hyperlipidemia type  Further recommendations will be given according to labs results. Continue low fat diet.  -     Comprehensive metabolic panel -     Lipid panel  BMI 32.0-32.9,adult  Wt overall stable. Hx of prediabetes. We discussed benefits of wt loss as well as adverse effects of  obesity. Consistency with healthy diet and physical activity recommended.   -     Hemoglobin A1c  Allergic rhinitis, unspecified seasonality, unspecified trigger  Improved.  No changes in current management. F/U as needed.  -     fluticasone (FLONASE) 50 MCG/ACT nasal spray; Place 1 spray into both nostrils 2 (two) times daily.  Enlarged thyroid gland  Incidental finding on examination. Further recommendations will be given according to imaging results.    Lab Results  Component Value Date   TSH 1.14 05/24/2016    -     US THYROID; Future    -Ms. Divina Neale [place] was advised to return sooner than planned today if new concerns arise.       Betty G. [place], MD  [facility]. [place] office.

## 2016-08-31 ENCOUNTER — Encounter: Payer: Self-pay | Admitting: Family Medicine

## 2016-12-15 DIAGNOSIS — H43313 Vitreous membranes and strands, bilateral: Secondary | ICD-10-CM | POA: Diagnosis not present

## 2016-12-15 DIAGNOSIS — H35361 Drusen (degenerative) of macula, right eye: Secondary | ICD-10-CM | POA: Diagnosis not present

## 2016-12-20 ENCOUNTER — Ambulatory Visit
Admission: RE | Admit: 2016-12-20 | Discharge: 2016-12-20 | Disposition: A | Discharge status: Home / Self Care | Payer: Medicare Other | Source: Ambulatory Visit | Attending: Family Medicine | Admitting: Family Medicine

## 2016-12-20 DIAGNOSIS — E049 Nontoxic goiter, unspecified: Secondary | ICD-10-CM

## 2016-12-20 DIAGNOSIS — E042 Nontoxic multinodular goiter: Secondary | ICD-10-CM | POA: Diagnosis not present

## 2016-12-23 ENCOUNTER — Encounter: Payer: Self-pay | Admitting: Family Medicine

## 2016-12-23 DIAGNOSIS — E042 Nontoxic multinodular goiter: Secondary | ICD-10-CM | POA: Insufficient documentation

## 2016-12-23 NOTE — Progress Notes (Signed)
HPI:   [name] is a 66 y.o. female, who is here today for 4 months follow up.   She was last seen on 08/23/16  Last OV noted mild thyromegaly, she has thyroid US done on 12/20/16:  1. Findings suggestive of multinodular goiter. 2. Nodules #1 and #4 both meet imaging criteria to recommend percutaneous sampling as clinically indicated.  Lab Results  Component Value Date   TSH 1.14 05/24/2016    HTN: Currently she is on Lisinopril 40 mg daily, Metoprolol Succinate 25 mg daily, and Maxzide 37.5-25 mg daily.  Home BP readings: Not checking. She is following low salt diet. Last eye exam: 06/2016.  Denies headache, visual changes, chest pain, dyspnea, palpitation, claudication, focal weakness, or edema.  Lab Results  Component Value Date   CREATININE 0.99 08/23/2016   BUN 10 08/23/2016   NA 136 08/23/2016   K 3.6 08/23/2016   CL 99 08/23/2016   CO2 31 08/23/2016    Obesity:  Dietary changes since her last OV: Trying to do better but not consistently due to frequent travel. Exercise: Not consistently.  Hx of IFG.  Lab Results  Component Value Date   HGBA1C 6.3 08/23/2016     Hyperlipidemia:  Currently on Lipitor 40 mg daily. Last OV recommended increasing dose of Lipitor to 80 mg or changing to Crestor but she did not reply. She is still taking Lipitor 40 mg daily.  Following a low fat diet: Has tried to do better.  She has not noted side effects with medication.  Lab Results  Component Value Date   CHOL 214 (H) 08/23/2016   HDL 35.10 (L) 08/23/2016   LDLCALC 145 (H) 08/23/2016   TRIG 170.0 (H) 08/23/2016   CHOLHDL 6 08/23/2016   She is requesting a referral to ophthalmologists. C/O seeing "laser beans" intermittent in visual fields for about 3 weeks, worse at night. She already had eye exam and according to pt, she was told to monitor and follow if worsening. She denies associated headache,nasuea,vomiting,or lost of vision.  She feels  like this problem is restricting her driving because she is more sensitive to lights from cars coming from the opposite direction.  -She is also due for colonoscopy.   Review of Systems  Constitutional: Positive for fatigue. Negative for activity change, appetite change, fever and unexpected weight change.  HENT: Negative for mouth sores, nosebleeds, sinus pressure, sore throat and trouble swallowing.   Eyes: Positive for visual disturbance. Negative for photophobia, pain, discharge, redness and itching.  Respiratory: Negative for cough, shortness of breath and wheezing.   Cardiovascular: Negative for chest pain, palpitations and leg swelling.  Gastrointestinal: Negative for abdominal pain, nausea and vomiting.       Negative for changes in bowel habits.  Endocrine: Negative for cold intolerance, heat intolerance, polydipsia, polyphagia and polyuria.  Genitourinary: Negative for decreased urine volume, dysuria and hematuria.  Musculoskeletal: Negative for gait problem and myalgias.  Allergic/Immunologic: Positive for environmental allergies.  Neurological: Negative for syncope, weakness and headaches.  Psychiatric/Behavioral: Negative for confusion. The patient is nervous/anxious.     Current Outpatient Prescriptions on File Prior to Visit  Medication Sig Dispense Refill  . atorvastatin (LIPITOR) 40 MG tablet Take 1 tablet (40 mg total) by mouth daily. 90 tablet 1  . fluticasone (FLONASE) 50 MCG/ACT nasal spray Place 1 spray into both nostrils 2 (two) times daily. 16 g 6  . lisinopril (PRINIVIL,ZESTRIL) 40 MG tablet Take 1 tablet (40 mg  total) by mouth daily. 90 tablet 1  . metoprolol succinate (TOPROL-XL) 25 MG 24 hr tablet Take 1 tablet (25 mg total) by mouth daily. 30 tablet 2  . omeprazole (PRILOSEC) 40 MG capsule Take 1 capsule (40 mg total) by mouth daily before breakfast. 30 capsule 3  . triamterene-hydrochlorothiazide (MAXZIDE-25) 37.5-25 MG tablet Take 1 tablet by mouth daily. 90  tablet 3   No current facility-administered medications on file prior to visit.      Past Medical History:  Diagnosis Date  . Allergy   . GERD (gastroesophageal reflux disease)   . Hyperlipidemia   . Hypertension   . Vitamin D deficiency    Allergies  Allergen Reactions  . Penicillins Hives    Social History   Social History  . Marital status: Divorced    Spouse name: N/A  . Number of children: N/A  . Years of education: N/A   Social History Main Topics  . Smoking status: Never Smoker  . Smokeless tobacco: Never Used  . Alcohol use Yes     Comment: Occ  . Drug use: No  . Sexual activity: Not Asked   Other Topics Concern  . None   Social History Narrative  . None    Vitals:   12/24/16 0830  BP: 128/80  Pulse: 80  Resp: 12  SpO2: 95%   Body mass index is 31.86 kg/m.  Wt Readings from Last 3 Encounters:  12/24/16 171 lb 6 oz (77.7 kg)  08/23/16 172 lb 4 oz (78.1 kg)  07/30/16 171 lb 9.6 oz (77.8 kg)     Physical Exam  Nursing note and vitals reviewed. Constitutional: She is oriented to person, place, and time. She appears well-developed. No distress.  HENT:  Head: Normocephalic and atraumatic.  Mouth/Throat: Oropharynx is clear and moist and mucous membranes are normal.  Eyes: Pupils are equal, round, and reactive to light. Conjunctivae are normal.  Neck: No tracheal deviation present. Thyromegaly present. No thyroid mass present.  Cardiovascular: Normal rate and regular rhythm.   No murmur heard. Pulses:      Dorsalis pedis pulses are 2+ on the right side, and 2+ on the left side.  Respiratory: Effort normal and breath sounds normal. No respiratory distress.  GI: Soft. She exhibits no mass. There is no hepatomegaly. There is no tenderness.  Musculoskeletal: She exhibits no edema or tenderness.  Lymphadenopathy:    She has no cervical adenopathy.  Neurological: She is alert and oriented to person, place, and time. She has normal strength.  Coordination and gait normal.  Skin: Skin is warm. No rash noted. No erythema.  Psychiatric: Her mood appears anxious.  Well groomed, good eye contact.    ASSESSMENT AND PLAN:   [name] was seen today for 4 months follow-up.  Diagnoses and all orders for this visit:  Lab Results  Component Value Date   CREATININE 1.01 12/24/2016   BUN 16 12/24/2016   NA 135 12/24/2016   K 3.8 12/24/2016   CL 96 12/24/2016   CO2 33 (H) 12/24/2016   Lab Results  Component Value Date   CHOL 207 (H) 12/24/2016   HDL 38.40 (L) 12/24/2016   LDLCALC 132 (H) 12/24/2016   TRIG 186.0 (H) 12/24/2016   CHOLHDL 5 12/24/2016    Hypertension, essential, benign  Adequately controlled. No changes in current management. DASH-low salt diet recommended. Eye exam recommended annually. F/U in 6 months, before if needed.  -     Basic metabolic  panel  BMI 32.0-32.9,adult  Wt stable. We discussed benefits of wt loss as well as adverse effects of obesity. Consistency with healthy diet and physical activity recommended. Daily brisk walking for 15-30 min as tolerated.  Multinodular goiter  Thyroid US with 2 nodules that meet criteria for Bx. We discussed options: Nuclear thyroid scan vs referral to endocrinology to discuss Bx. She is not interested in nuclear studies, referral to endocrinology placed.  -     Ambulatory referral to Endocrinology  Hyperlipidemia, unspecified hyperlipidemia type  No changes in current management, will follow labs done today and will give further recommendations accordingly.  The 10-year ASCVD risk score Denman George DC Montez Hageman., et al., 2013) is: 10.9%   Values used to calculate the score:     Age: 72 years     Sex: Female     Is Non-Hispanic African American: Yes     Diabetic: No     Tobacco smoker: No     Systolic Blood Pressure: 128 mmHg     Is BP treated: Yes     HDL Cholesterol: 38.4 mg/dL     Total Cholesterol: 207 mg/dL  -     Lipid panel  Abnormal  visual perception  Instructed about warning signs. Ophthalm referral placed.  -     Ambulatory referral to Ophthalmology  Need for influenza vaccination -     Flu vaccine HIGH DOSE PF  Colon cancer screening -     Ambulatory referral to Gastroenterology    -Ms. Alvina Strother [place] was advised to return sooner than planned today if new concerns arise.       Adem Costlow G. [place], MD  [facility]. [place] office.

## 2016-12-24 ENCOUNTER — Ambulatory Visit (INDEPENDENT_AMBULATORY_CARE_PROVIDER_SITE_OTHER): Payer: Medicare Other | Admitting: Family Medicine

## 2016-12-24 ENCOUNTER — Encounter: Payer: Self-pay | Admitting: Family Medicine

## 2016-12-24 VITALS — BP 128/80 | HR 80 | Resp 12 | Ht 61.5 in | Wt 171.4 lb

## 2016-12-24 DIAGNOSIS — E785 Hyperlipidemia, unspecified: Secondary | ICD-10-CM

## 2016-12-24 DIAGNOSIS — E042 Nontoxic multinodular goiter: Secondary | ICD-10-CM | POA: Diagnosis not present

## 2016-12-24 DIAGNOSIS — Z1211 Encounter for screening for malignant neoplasm of colon: Secondary | ICD-10-CM

## 2016-12-24 DIAGNOSIS — H539 Unspecified visual disturbance: Secondary | ICD-10-CM | POA: Diagnosis not present

## 2016-12-24 DIAGNOSIS — I10 Essential (primary) hypertension: Secondary | ICD-10-CM | POA: Diagnosis not present

## 2016-12-24 DIAGNOSIS — Z23 Encounter for immunization: Secondary | ICD-10-CM | POA: Diagnosis not present

## 2016-12-24 DIAGNOSIS — Z6832 Body mass index (BMI) 32.0-32.9, adult: Secondary | ICD-10-CM

## 2016-12-24 LAB — LIPID PANEL
CHOLESTEROL: 207 mg/dL — AB (ref 0–200)
HDL: 38.4 mg/dL — AB (ref >39.00)
LDL CALC: 132 mg/dL — AB (ref 0–99)
NonHDL: 168.9
TRIGLYCERIDES: 186 mg/dL — AB (ref 0.0–149.0)
Total CHOL/HDL Ratio: 5
VLDL: 37.2 mg/dL (ref 0.0–40.0)

## 2016-12-24 LAB — BASIC METABOLIC PANEL
BUN: 16 mg/dL (ref 6–23)
CHLORIDE: 96 meq/L (ref 96–112)
CO2: 33 mEq/L — ABNORMAL HIGH (ref 19–32)
Calcium: 9.7 mg/dL (ref 8.4–10.5)
Creatinine, Ser: 1.01 mg/dL (ref 0.40–1.20)
GFR: 70.11 mL/min (ref >60.00)
Glucose, Bld: 98 mg/dL (ref 70–99)
POTASSIUM: 3.8 meq/L (ref 3.5–5.1)
SODIUM: 135 meq/L (ref 135–145)

## 2016-12-24 NOTE — Patient Instructions (Addendum)
A few things to remember from today's visit:   Hypertension, essential, benign - Plan: Basic metabolic panel, Lipid panel  BMI 32.0-32.9,adult  Multinodular goiter - Plan: Ambulatory referral to Endocrinology  Need for influenza vaccination - Plan: Flu vaccine HIGH DOSE PF  Colon cancer screening - Plan: Ambulatory referral to Gastroenterology  Abnormal visual perception - Plan: Ambulatory referral to Ophthalmology  Hyperlipidemia, unspecified hyperlipidemia type  DASH Eating Plan DASH stands for "Dietary Approaches to Stop Hypertension." The DASH eating plan is a healthy eating plan that has been shown to reduce high blood pressure (hypertension). It may also reduce your risk for type 2 diabetes, heart disease, and stroke. The DASH eating plan may also help with weight loss. What are tips for following this plan? General guidelines  Avoid eating more than 2,300 mg (milligrams) of salt (sodium) a day. If you have hypertension, you may need to reduce your sodium intake to 1,500 mg a day.  Limit alcohol intake to no more than 1 drink a day for nonpregnant women and 2 drinks a day for men. One drink equals 12 oz of beer, 5 oz of wine, or 1 oz of hard liquor.  Work with your health care provider to maintain a healthy body weight or to lose weight. Ask what an ideal weight is for you.  Get at least 30 minutes of exercise that causes your heart to beat faster (aerobic exercise) most days of the week. Activities may include walking, swimming, or biking.  Work with your health care provider or diet and nutrition specialist (dietitian) to adjust your eating plan to your individual calorie needs. Reading food labels  Check food labels for the amount of sodium per serving. Choose foods with less than 5 percent of the Daily Value of sodium. Generally, foods with less than 300 mg of sodium per serving fit into this eating plan.  To find whole grains, look for the word "whole" as the first word  in the ingredient list. Shopping  Buy products labeled as "low-sodium" or "no salt added."  Buy fresh foods. Avoid canned foods and premade or frozen meals. Cooking  Avoid adding salt when cooking. Use salt-free seasonings or herbs instead of table salt or sea salt. Check with your health care provider or pharmacist before using salt substitutes.  Do not fry foods. Cook foods using healthy methods such as baking, boiling, grilling, and broiling instead.  Cook with heart-healthy oils, such as olive, canola, soybean, or sunflower oil. Meal planning   Eat a balanced diet that includes: ? 5 or more servings of fruits and vegetables each day. At each meal, try to fill half of your plate with fruits and vegetables. ? Up to 6-8 servings of whole grains each day. ? Less than 6 oz of lean meat, poultry, or fish each day. A 3-oz serving of meat is about the same size as a deck of cards. One egg equals 1 oz. ? 2 servings of low-fat dairy each day. ? A serving of nuts, seeds, or beans 5 times each week. ? Heart-healthy fats. Healthy fats called Omega-3 fatty acids are found in foods such as flaxseeds and coldwater fish, like sardines, salmon, and mackerel.  Limit how much you eat of the following: ? Canned or prepackaged foods. ? Food that is high in trans fat, such as fried foods. ? Food that is high in saturated fat, such as fatty meat. ? Sweets, desserts, sugary drinks, and other foods with added sugar. ? Full-fat dairy  products.  Do not salt foods before eating.  Try to eat at least 2 vegetarian meals each week.  Eat more home-cooked food and less restaurant, buffet, and fast food.  When eating at a restaurant, ask that your food be prepared with less salt or no salt, if possible. What foods are recommended? The items listed may not be a complete list. Talk with your dietitian about what dietary choices are best for you. Grains Whole-grain or whole-wheat bread. Whole-grain or  whole-wheat pasta. Brown rice. Orpah Cobb. Bulgur. Whole-grain and low-sodium cereals. Pita bread. Low-fat, low-sodium crackers. Whole-wheat flour tortillas. Vegetables Fresh or frozen vegetables (raw, steamed, roasted, or grilled). Low-sodium or reduced-sodium tomato and vegetable juice. Low-sodium or reduced-sodium tomato sauce and tomato paste. Low-sodium or reduced-sodium canned vegetables. Fruits All fresh, dried, or frozen fruit. Canned fruit in natural juice (without added sugar). Meat and other protein foods Skinless chicken or [place]. Ground chicken or [place]. Pork with fat trimmed off. Fish and seafood. Egg whites. Dried beans, peas, or lentils. Unsalted nuts, nut butters, and seeds. Unsalted canned beans. Lean cuts of beef with fat trimmed off. Low-sodium, lean deli meat. Dairy Low-fat (1%) or fat-free (skim) milk. Fat-free, low-fat, or reduced-fat cheeses. Nonfat, low-sodium ricotta or cottage cheese. Low-fat or nonfat yogurt. Low-fat, low-sodium cheese. Fats and oils Soft margarine without trans fats. Vegetable oil. Low-fat, reduced-fat, or light mayonnaise and salad dressings (reduced-sodium). Canola, safflower, olive, soybean, and sunflower oils. Avocado. Seasoning and other foods Herbs. Spices. Seasoning mixes without salt. Unsalted popcorn and pretzels. Fat-free sweets. What foods are not recommended? The items listed may not be a complete list. Talk with your dietitian about what dietary choices are best for you. Grains Baked goods made with fat, such as croissants, muffins, or some breads. Dry pasta or rice meal packs. Vegetables Creamed or fried vegetables. Vegetables in a cheese sauce. Regular canned vegetables (not low-sodium or reduced-sodium). Regular canned tomato sauce and paste (not low-sodium or reduced-sodium). Regular tomato and vegetable juice (not low-sodium or reduced-sodium). Rosita Fire. Olives. Fruits Canned fruit in a light or heavy syrup. Fried fruit. Fruit  in cream or butter sauce. Meat and other protein foods Fatty cuts of meat. Ribs. Fried meat. Tomasa Blase. Sausage. Bologna and other processed lunch meats. Salami. Fatback. Hotdogs. Bratwurst. Salted nuts and seeds. Canned beans with added salt. Canned or smoked fish. Whole eggs or egg yolks. Chicken or [place] with skin. Dairy Whole or 2% milk, cream, and half-and-half. Whole or full-fat cream cheese. Whole-fat or sweetened yogurt. Full-fat cheese. Nondairy creamers. Whipped toppings. Processed cheese and cheese spreads. Fats and oils Butter. Stick margarine. Lard. Shortening. Ghee. Bacon fat. Tropical oils, such as coconut, palm kernel, or palm oil. Seasoning and other foods Salted popcorn and pretzels. Onion salt, garlic salt, seasoned salt, table salt, and sea salt. Worcestershire sauce. Tartar sauce. Barbecue sauce. Teriyaki sauce. Soy sauce, including reduced-sodium. Steak sauce. Canned and packaged gravies. Fish sauce. Oyster sauce. Cocktail sauce. Horseradish that you find on the shelf. Ketchup. Mustard. Meat flavorings and tenderizers. Bouillon cubes. Hot sauce and Tabasco sauce. Premade or packaged marinades. Premade or packaged taco seasonings. Relishes. Regular salad dressings. Where to find more information:  National Heart, Lung, and Blood Institute: PopSteam.is  American Heart Association: www.heart.org Summary  The DASH eating plan is a healthy eating plan that has been shown to reduce high blood pressure (hypertension). It may also reduce your risk for type 2 diabetes, heart disease, and stroke.  With the DASH eating plan, you should limit  salt (sodium) intake to 2,300 mg a day. If you have hypertension, you may need to reduce your sodium intake to 1,500 mg a day.  When on the DASH eating plan, aim to eat more fresh fruits and vegetables, whole grains, lean proteins, low-fat dairy, and heart-healthy fats.  Work with your health care provider or diet and nutrition specialist  (dietitian) to adjust your eating plan to your individual calorie needs. This information is not intended to replace advice given to you by your health care provider. Make sure you discuss any questions you have with your health care provider. Document Released: 03/11/2011 Document Revised: 03/15/2016 Document Reviewed: 03/15/2016 Elsevier Interactive Patient Education  2017 [employer].   Please be sure medication list is accurate. If a new problem present, please set up appointment sooner than planned today.

## 2016-12-26 ENCOUNTER — Encounter: Payer: Self-pay | Admitting: Family Medicine

## 2016-12-27 ENCOUNTER — Other Ambulatory Visit: Payer: Self-pay | Admitting: Family Medicine

## 2016-12-27 DIAGNOSIS — I10 Essential (primary) hypertension: Secondary | ICD-10-CM

## 2017-01-14 ENCOUNTER — Other Ambulatory Visit: Payer: Self-pay | Admitting: Family Medicine

## 2017-01-14 ENCOUNTER — Other Ambulatory Visit: Payer: Self-pay | Admitting: Specialist

## 2017-01-14 DIAGNOSIS — Z1231 Encounter for screening mammogram for malignant neoplasm of breast: Secondary | ICD-10-CM

## 2017-01-20 ENCOUNTER — Other Ambulatory Visit (HOSPITAL_COMMUNITY)
Admission: RE | Admit: 2017-01-20 | Discharge: 2017-01-20 | Disposition: A | Discharge status: Home / Self Care | Payer: Medicare Other | Source: Ambulatory Visit | Attending: Endocrinology | Admitting: Endocrinology

## 2017-01-20 ENCOUNTER — Ambulatory Visit (INDEPENDENT_AMBULATORY_CARE_PROVIDER_SITE_OTHER): Payer: Medicare Other | Admitting: Endocrinology

## 2017-01-20 ENCOUNTER — Encounter: Payer: Self-pay | Admitting: Endocrinology

## 2017-01-20 VITALS — BP 138/78 | HR 66 | Wt 173.4 lb

## 2017-01-20 DIAGNOSIS — E042 Nontoxic multinodular goiter: Secondary | ICD-10-CM | POA: Insufficient documentation

## 2017-01-20 DIAGNOSIS — E0789 Other specified disorders of thyroid: Secondary | ICD-10-CM | POA: Diagnosis not present

## 2017-01-20 NOTE — Patient Instructions (Signed)
We'll let you know about the biopsy results. If as expected, no cancer is found, Please come back for a follow-up appointment in 6-12 months. 

## 2017-01-20 NOTE — Progress Notes (Signed)
Subjective:    Patient ID: Latasha CumminsLinda Faye Young, female    DOB: 09/26/1950, 66 y.o.   MRN: [id number]  HPI Pt is referred by Dr [place], for nodular thyroid.  2 mos ago, pt was noted on routine exam to have a goiter.  she is unaware of ever having had thyroid problems in the past.  She has slight swelling at the the neck, bit no assoc pain.  she has no h/o XRT or surgery to the neck.   Past Medical History:  Diagnosis Date  . Allergy   . GERD (gastroesophageal reflux disease)   . Hyperlipidemia   . Hypertension   . Vitamin D deficiency     Past Surgical History:  Procedure Laterality Date  . ABDOMINAL HYSTERECTOMY    . WISDOM TOOTH EXTRACTION      Social History   Social History  . Marital status: Divorced    Spouse name: N/A  . Number of children: N/A  . Years of education: N/A   Occupational History  . Not on file.   Social History Main Topics  . Smoking status: Never Smoker  . Smokeless tobacco: Never Used  . Alcohol use Yes     Comment: Occ  . Drug use: No  . Sexual activity: Not on file   Other Topics Concern  . Not on file   Social History Narrative  . No narrative on file    Current Outpatient Prescriptions on File Prior to Visit  Medication Sig Dispense Refill  . atorvastatin (LIPITOR) 40 MG tablet Take 1 tablet (40 mg total) by mouth daily. 90 tablet 1  . fluticasone (FLONASE) 50 MCG/ACT nasal spray Place 1 spray into both nostrils 2 (two) times daily. 16 g 6  . lisinopril (PRINIVIL,ZESTRIL) 40 MG tablet Take 1 tablet (40 mg total) by mouth daily. 90 tablet 1  . metoprolol succinate (TOPROL-XL) 25 MG 24 hr tablet TAKE 1 TABLET BY MOUTH DAILY 90 tablet 2  . omeprazole (PRILOSEC) 40 MG capsule Take 1 capsule (40 mg total) by mouth daily before breakfast. 30 capsule 3  . triamterene-hydrochlorothiazide (MAXZIDE-25) 37.5-25 MG tablet Take 1 tablet by mouth daily. 90 tablet 3   No current facility-administered medications on file prior to visit.      Allergies  Allergen Reactions  . Penicillins Hives    Family History  Problem Relation Age of Onset  . Stroke Mother   . Hypertension Mother   . Stroke Brother   . Hypertension Maternal Grandmother   . Hypertension Maternal Grandfather   . Alcohol abuse Brother   . Hypertension Brother   . Stroke Brother   . Thyroid disease Neg Hx     BP 138/78   Pulse 66   Wt 173 lb 6.4 oz (78.7 kg)   SpO2 98%   BMI 32.23 kg/m    Review of Systems Denies visual loss, chest pain, sob, cough, dysphagia, diarrhea, itching, flushing, easy bruising, depression, cold intolerance, headache, numbness, and rhinorrhea.  She has weight gain and slight hoarseness.      Objective:   Physical Exam VS: see vs page GEN: no distress HEAD: head: no deformity eyes: no periorbital swelling, no proptosis.  external nose and ears are normal.  mouth: no lesion seen NECK: the right and left thyroid nodules are palpable.   CHEST WALL: no deformity LUNGS: clear to auscultation CV: reg rate and rhythm, no murmur ABD: abdomen is soft, nontender.  no hepatosplenomegaly.  not distended.  no hernia MUSCULOSKELETAL:  muscle bulk and strength are grossly normal.  no obvious joint swelling.  gait is normal and steady EXTEMITIES: no deformity.  no edema PULSES: no carotid bruit NEURO:  cn 2-12 grossly intact.   readily moves all 4's.  sensation is intact to touch on all 4's.  No tremor.   SKIN:  Normal texture and temperature.  No rash or suspicious lesion is visible.  Not diaphoretic.  NODES:  None palpable at the neck PSYCH: alert, well-oriented.  Does not appear anxious nor depressed.   Lab Results  Component Value Date   TSH 1.14 05/24/2016   [place]: multinodular goiter  thyroid needle bx: consent obtained, signed form on chart The area is first sprayed with cooling agent local: xylocaine 2%, with epinephrine.  prep: alcohol pad.  3-4 bxs are done with 25 and 27g needles, for each of the 2 most  sonographically concerning nodules (L and R) no complications.   I have reviewed outside records, and summarized: Pt was noted to have goiter, and referred here.  Multiple other med probs were addressed, and goiter was noted on PE     Assessment & Plan:  Multinodular goiter, new, uncertain etiology   Patient Instructions  We'll let you know about the biopsy results. If as expected, no cancer is found, Please come back for a follow-up appointment in 6-12 months.

## 2017-02-01 ENCOUNTER — Ambulatory Visit
Admission: RE | Admit: 2017-02-01 | Discharge: 2017-02-01 | Disposition: A | Discharge status: Home / Self Care | Payer: Medicare Other | Source: Ambulatory Visit | Attending: Family Medicine | Admitting: Family Medicine

## 2017-02-01 DIAGNOSIS — Z1231 Encounter for screening mammogram for malignant neoplasm of breast: Secondary | ICD-10-CM | POA: Diagnosis not present

## 2017-02-18 ENCOUNTER — Other Ambulatory Visit: Payer: Self-pay | Admitting: Family Medicine

## 2017-02-18 DIAGNOSIS — K219 Gastro-esophageal reflux disease without esophagitis: Secondary | ICD-10-CM

## 2017-03-22 ENCOUNTER — Other Ambulatory Visit: Payer: Self-pay | Admitting: Family Medicine

## 2017-03-22 DIAGNOSIS — I10 Essential (primary) hypertension: Secondary | ICD-10-CM

## 2017-03-30 ENCOUNTER — Encounter: Payer: Self-pay | Admitting: Family Medicine

## 2017-04-22 ENCOUNTER — Encounter: Payer: Self-pay | Admitting: Gastroenterology

## 2017-04-26 DIAGNOSIS — H43813 Vitreous degeneration, bilateral: Secondary | ICD-10-CM | POA: Diagnosis not present

## 2017-06-27 ENCOUNTER — Other Ambulatory Visit: Payer: Self-pay

## 2017-06-27 ENCOUNTER — Encounter: Payer: Self-pay | Admitting: Gastroenterology

## 2017-06-27 ENCOUNTER — Ambulatory Visit (AMBULATORY_SURGERY_CENTER): Payer: Self-pay | Admitting: *Deleted

## 2017-06-27 VITALS — Ht 62.0 in | Wt 175.0 lb

## 2017-06-27 DIAGNOSIS — Z1211 Encounter for screening for malignant neoplasm of colon: Secondary | ICD-10-CM

## 2017-06-27 MED ORDER — NA SULFATE-K SULFATE-MG SULF 17.5-3.13-1.6 GM/177ML PO SOLN: ORAL | 0 refills | Status: DC

## 2017-06-27 NOTE — Progress Notes (Signed)
No egg or soy allergy  No intubation or anesthesia problems per pt.  No diet medications  PNM than $50 coupon given  No home oxygen or hx of sleep apnea  [place]

## 2017-07-04 ENCOUNTER — Encounter: Payer: Self-pay | Admitting: Family Medicine

## 2017-07-04 ENCOUNTER — Ambulatory Visit: Payer: Medicare Other | Admitting: Family Medicine

## 2017-07-04 VITALS — BP 126/82 | HR 82 | Temp 98.6°F | Resp 12 | Ht 62.0 in | Wt 174.4 lb

## 2017-07-04 DIAGNOSIS — N764 Abscess of vulva: Secondary | ICD-10-CM | POA: Diagnosis not present

## 2017-07-04 MED ORDER — SULFAMETHOXAZOLE-TRIMETHOPRIM 800-160 MG PO TABS: 1.0000 | ORAL_TABLET | Freq: Two times a day (BID) | ORAL | 0 refills | Status: AC

## 2017-07-04 NOTE — Progress Notes (Signed)
ACUTE VISIT   HPI:  Chief Complaint  Patient presents with  . Boil in vaginal area    started Saturday, painful and itchy    [name] is a 67 y.o. female, who is here today complaining of 2 days of soreness on external genitals. She states that for "long time" she has felt a "bump", which she thought was an ingrown hair. Lesion has been pruritic, she was treating lesion with coconut oil.  2 days ago she noted swelling on area and severe pain, exacerbated by getting up and walking, alleviated by sitting still. Pain is constant, 7/10, no radiated.  She denies fever, chills, body aches, abdominal pain, vaginal discharge, vaginal bleeding, numbness or tingling.  Today she noted that lesion has decreased in size  But is still tender.  She has not taking OTC analgesic.   Review of Systems  Constitutional: Positive for activity change. Negative for appetite change, chills, fatigue and fever.  HENT: Negative for mouth sores.   Cardiovascular: Negative for leg swelling.  Gastrointestinal: Negative for abdominal pain, nausea and vomiting.       No changes in bowel habits.  Genitourinary: Negative for decreased urine volume, dysuria, hematuria, vaginal bleeding and vaginal discharge.  Musculoskeletal: Negative for back pain and myalgias.  Skin: Positive for rash.  Hematological: Negative for adenopathy. Does not bruise/bleed easily.  Psychiatric/Behavioral: Negative for confusion. The patient is nervous/anxious.       Current Outpatient Medications on File Prior to Visit  Medication Sig Dispense Refill  . atorvastatin (LIPITOR) 40 MG tablet Take 1 tablet (40 mg total) by mouth daily. 90 tablet 1  . Cholecalciferol (VITAMIN D3) 50000 units TABS Take by mouth once a week.    Marland Kitchen lisinopril (PRINIVIL,ZESTRIL) 40 MG tablet TAKE 1 TABLET BY MOUTH DAILY 90 tablet 1  . metoprolol succinate (TOPROL-XL) 25 MG 24 hr tablet TAKE 1 TABLET BY MOUTH DAILY 90 tablet 2  .  Multiple Vitamins-Minerals (MULTIVITAMIN ADULT PO) Take by mouth daily.    Marland Kitchen omeprazole (PRILOSEC) 40 MG capsule TAKE 1 CAPSULE BY MOUTH DAILY BEFORE  BREAKFAST 30 capsule 3  . triamterene-hydrochlorothiazide (MAXZIDE-25) 37.5-25 MG tablet Take 1 tablet by mouth daily. 90 tablet 3  . fluticasone (FLONASE) 50 MCG/ACT nasal spray Place 1 spray into both nostrils 2 (two) times daily. (Patient not taking: Reported on 06/27/2017) 16 g 6  . Na Sulfate-K Sulfate-Mg Sulf (SUPREP BOWEL PREP KIT) 17.5-3.13-1.6 GM/177ML SOLN Suprep as directed, no substitutions (Patient not taking: Reported on 07/04/2017) 354 mL 0   No current facility-administered medications on file prior to visit.      Past Medical History:  Diagnosis Date  . Allergy   . Cataract    "beginnings of"  . GERD (gastroesophageal reflux disease)   . Hyperlipidemia   . Hypertension   . Thyroid disease    2 benign nodules noted- check yearly  . Vitamin D deficiency    Allergies  Allergen Reactions  . Penicillins Hives    Social History   Socioeconomic History  . Marital status: Divorced    Spouse name: Not on file  . Number of children: Not on file  . Years of education: Not on file  . Highest education level: Not on file  Occupational History  . Not on file  Social Needs  . Financial resource strain: Not on file  . Food insecurity:    Worry: Not on file    Inability: Not on file  .  Transportation needs:    Medical: Not on file    Non-medical: Not on file  Tobacco Use  . Smoking status: Never Smoker  . Smokeless tobacco: Never Used  Substance and Sexual Activity  . Alcohol use: Yes    Comment: Occ  . Drug use: No  . Sexual activity: Not on file  Lifestyle  . Physical activity:    Days per week: Not on file    Minutes per session: Not on file  . Stress: Not on file  Relationships  . Social connections:    Talks on phone: Not on file    Gets together: Not on file    Attends religious service: Not on file     Active member of club or organization: Not on file    Attends meetings of clubs or organizations: Not on file    Relationship status: Not on file  Other Topics Concern  . Not on file  Social History Narrative  . Not on file    Vitals:   07/04/17 1237  BP: 126/82  Pulse: 82  Resp: 12  Temp: 98.6 F (37 C)  SpO2: 96%   Body mass index is 31.89 kg/m.    Physical Exam  Nursing note and vitals reviewed. Constitutional: She is oriented to person, place, and time. She appears well-developed. No distress.  HENT:  Head: Normocephalic and atraumatic.  Eyes: Conjunctivae are normal.  Cardiovascular: Normal rate and regular rhythm.  Respiratory: Effort normal. No respiratory distress.  GI: Soft. She exhibits no mass. There is no hepatomegaly. There is no tenderness.  Genitourinary:    There is no rash, tenderness or lesion on the right labia. There is tenderness and lesion on the left labia. No erythema in the vagina. No vaginal discharge found.  Genitourinary Comments: Examination done without chaperone per pt preference. About 2 cm nodular lesion with superficial 1 mm excoriation.  No active drainage, no fluctuant or indurated area appreciated.    Musculoskeletal: She exhibits no edema.  Neurological: She is alert and oriented to person, place, and time.  Skin: Skin is warm. Lesion noted. No erythema.  Psychiatric: Her speech is normal. Her mood appears anxious.  Well groomed, good eye contact.      ASSESSMENT AND PLAN:    [name] was seen today for boil in vaginal area.  Diagnoses and all orders for this visit:  Abscess of labia majora -     sulfamethoxazole-trimethoprim (BACTRIM DS,SEPTRA DS) 800-160 MG tablet; Take 1 tablet by mouth 2 (two) times daily for 7 days.   We discussed possible etiologies, certainly can be a complication from a ingrown hair. It seems like it drained spontaneously, no drainage obtained upon gentle pressure and no fluctuant area  appreciated. Recommend oral antibiotics and bath Sitz with warm water once to twice per day.  She was instructed to let me know if not great improvement in 72 hours, in which case we need to consider gynecologic evaluation.   -[name] was advised to seek immediate medical attention if sudden worsening symptoms or to follow if they persist or if new concerns arise.       Betty G. Martinique, MD  [facility]. [place] office.

## 2017-07-04 NOTE — Patient Instructions (Addendum)
A few things to remember from today's visit:   Abscess of labia majora - Plan: sulfamethoxazole-trimethoprim (BACTRIM DS,SEPTRA DS) 800-160 MG tablet   Skin Abscess A skin abscess is an infected area on or under your skin that contains pus and other material. An abscess can happen almost anywhere on your body. Some abscesses break open (rupture) on their own. Most continue to get worse unless they are treated. The infection can spread deeper into the body and into your blood, which can make you feel sick. Treatment usually involves draining the abscess. Follow these instructions at home: Abscess Care  If you have an abscess that has not drained, place a warm, clean, wet washcloth over the abscess several times a day. Do this as told by your doctor.  Follow instructions from your doctor about how to take care of your abscess. Make sure you: ? Cover the abscess with a bandage (dressing). ? Change your bandage or gauze as told by your doctor. ? Wash your hands with soap and water before you change the bandage or gauze. If you cannot use soap and water, use hand sanitizer.  Check your abscess every day for signs that the infection is getting worse. Check for: ? More redness, swelling, or pain. ? More fluid or blood. ? Warmth. ? More pus or a bad smell. Medicines   Take over-the-counter and prescription medicines only as told by your doctor.  If you were prescribed an antibiotic medicine, take it as told by your doctor. Do not stop taking the antibiotic even if you start to feel better. General instructions  To avoid spreading the infection: ? Do not share personal care items, towels, or hot tubs with others. ? Avoid making skin-to-skin contact with other people.  Keep all follow-up visits as told by your doctor. This is important. Contact a doctor if:  You have more redness, swelling, or pain around your abscess.  You have more fluid or blood coming from your abscess.  Your  abscess feels warm when you touch it.  You have more pus or a bad smell coming from your abscess.  You have a fever.  Your muscles ache.  You have chills.  You feel sick. Get help right away if:  You have very bad (severe) pain.  You see red streaks on your skin spreading away from the abscess. This information is not intended to replace advice given to you by your health care provider. Make sure you discuss any questions you have with your health care provider. Document Released: 09/08/2007 Document Revised: 11/16/2015 Document Reviewed: 01/29/2015 Elsevier Interactive Patient Education  2018 [employer]  Please be sure medication list is accurate. If a new problem present, please set up appointment sooner than planned today.

## 2017-07-11 ENCOUNTER — Encounter: Payer: Medicare Other | Admitting: Gastroenterology

## 2017-09-30 ENCOUNTER — Other Ambulatory Visit: Payer: Self-pay | Admitting: Family Medicine

## 2017-09-30 DIAGNOSIS — I10 Essential (primary) hypertension: Secondary | ICD-10-CM

## 2017-10-11 NOTE — Progress Notes (Signed)
Subjective:   Latasha Young is a 67 y.o. female who presents for an Initial Medicare Annual Wellness Visit.  Reports health as good Working on making it better  come off of BP med  Dr. 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 Hepatitis C Screening  [birth year]  . TETANUS/TDAP  01/11/1968  . COLONOSCOPY  01/11/1999  . PNA vac Low Risk Adult (1 of 2 - PCV13) 01/10/2014   Hepatitis C discussed by Dr. Martinique and is going to be drawn by Friday  Mammogram 01/2017 - due in October Dexa 02/2012 - Low risk (order in to repeat )   Colonoscopy (discussing with [employer])  Canceled due to copay and is looking for doctor who does one in the office      Objective:    Today's Vitals   10/12/17 0931  BP: 126/82  Pulse: 67  Weight: 173 lb (78.5 kg)  Height: '5\' 2"'  (1.575 m)   Body mass index is 31.64 kg/m.  Advanced Directives 10/12/2017 10/12/2017  Does Patient Have a Medical Advance Directive? No No    Current Medications (verified) Outpatient Encounter Medications as of 10/12/2017  Medication Sig  . atorvastatin (LIPITOR) 40 MG tablet Take 1 tablet (40 mg total) by mouth daily.  . Cholecalciferol (VITAMIN D3) 50000 units TABS Take by mouth once a week.  . fluticasone (FLONASE) 50 MCG/ACT nasal spray Place 1 spray into both nostrils 2 (two) times daily.  Marland Kitchen lisinopril (PRINIVIL,ZESTRIL) 40 MG tablet TAKE 1 TABLET BY MOUTH DAILY  . metoprolol succinate (TOPROL-XL) 25 MG 24 hr tablet TAKE 1 TABLET BY MOUTH DAILY  . Multiple Vitamins-Minerals (MULTIVITAMIN ADULT PO) Take by mouth daily.  . Na Sulfate-K Sulfate-Mg Sulf  (SUPREP BOWEL PREP KIT) 17.5-3.13-1.6 GM/177ML SOLN Suprep as directed, no substitutions  . omeprazole (PRILOSEC) 40 MG capsule TAKE 1 CAPSULE BY MOUTH DAILY BEFORE  BREAKFAST (Patient taking differently: TAKE 1 CAPSULE BY MOUTH DAILY)  . triamterene-hydrochlorothiazide (MAXZIDE-25) 37.5-25 MG tablet Take 1 tablet by mouth daily.  . [DISCONTINUED] triamterene-hydrochlorothiazide (MAXZIDE-25) 37.5-25 MG tablet Take 1 tablet by mouth daily.   No facility-administered encounter medications on file as of 10/12/2017.     Allergies (verified) Penicillins   History: Past Medical History:  Diagnosis Date  . Allergy   . Cataract    "beginnings of"  . GERD (gastroesophageal reflux disease)   . Hyperlipidemia   . Hypertension   . Thyroid disease    2 benign nodules noted- check yearly  . Vitamin D deficiency    Past Surgical History:  Procedure Laterality Date  . ABDOMINAL HYSTERECTOMY    . SIGMOIDOSCOPY    . WISDOM TOOTH EXTRACTION     Family History  Problem Relation Age of Onset  . Stroke Mother   . Hypertension Mother   . Stroke Brother   . Hypertension Maternal Grandmother   . Hypertension Maternal Grandfather   . Alcohol abuse Brother   . Hypertension Brother   . Stroke Brother   . Thyroid disease Neg Hx   . Colon  cancer Neg Hx   . Esophageal cancer Neg Hx   . Rectal cancer Neg Hx   . Stomach cancer Neg Hx    Social History   Socioeconomic History  . Marital status: Divorced    Spouse name: Not on file  . Number of children: Not on file  . Years of education: Not on file  . Highest education level: Not on file  Occupational History  . Not on file  Social Needs  . Financial resource strain: Not on file  . Food insecurity:    Worry: Not on file    Inability: Not on file  . Transportation needs:    Medical: Not on file    Non-medical: Not on file  Tobacco Use  . Smoking status: Never Smoker  . Smokeless tobacco: Never Used  Substance and Sexual Activity  .  Alcohol use: Yes    Comment: Occ  . Drug use: No  . Sexual activity: Not on file  Lifestyle  . Physical activity:    Days per week: Not on file    Minutes per session: Not on file  . Stress: Not on file  Relationships  . Social connections:    Talks on phone: Not on file    Gets together: Not on file    Attends religious service: Not on file    Active member of club or organization: Not on file    Attends meetings of clubs or organizations: Not on file    Relationship status: Not on file  Other Topics Concern  . Not on file  Social History Narrative  . Not on file    Tobacco Counseling Counseling given: Yes   Clinical Intake:  Activities of Daily Living No flowsheet data found.   Immunizations and Health Maintenance Immunization History  Administered Date(s) Administered  . Influenza, High Dose Seasonal PF 12/24/2016  . Pneumococcal Conjugate-13 04/06/2015  . Pneumococcal Polysaccharide-23 10/12/2017   Health Maintenance Due  Topic Date Due  . Hepatitis C Screening  05/22/1950  . TETANUS/TDAP  01/11/1968  . COLONOSCOPY  01/11/1999  . PNA vac Low Risk Adult (1 of 2 - PCV13) 01/10/2014    Patient Care Team: Martinique, Betty G, MD as PCP - General (Family Medicine)  Indicate any recent Medical Services you may have received from other than [facility] providers in the past year (date may be approximate).     Assessment:   This is a routine wellness examination for Latasha Young.  Hearing/Vision screen Hearing Screening Comments: Hearing  Vision Screening Comments: Vision - no issues Just changed optometrist  Dr. Martinique referred   Does have cataracts  Dietary issues and exercise activities discussed:    Goals    . Patient Stated     Plan for your personal time !    . Weight (lb) < 160 lb (72.6 kg)     Keep up the good work with Pharmacologist and weight loss       Depression Screen No flowsheet data found.  Fall Risk Fall Risk  10/12/2017  Falls in the  past year? No      Cognitive Function:   Ad8 score reviewed for issues:  Issues making decisions:  Less interest in hobbies / activities:  Repeats questions, stories (family complaining):  Trouble using ordinary gadgets (microwave, computer, phone):  Forgets the month or year:   Mismanaging finances:   Remembering appts:  Daily problems with thinking and/or memory: Ad8 score is=  Screening Tests Health Maintenance  Topic Date Due  . Hepatitis C Screening  Aug 20, 1950  . TETANUS/TDAP  01/11/1968  . COLONOSCOPY  01/11/1999  . PNA vac Low Risk Adult (1 of 2 - PCV13) 01/10/2014  . INFLUENZA VACCINE  11/03/2017  . MAMMOGRAM  02/02/2019  . DEXA SCAN  Completed         Plan:      PCP Notes   Health Maintenance Hepatitis C discussed by Dr. Martinique and is going to be drawn by Friday  Mammogram 01/2017 - due in October Dexa 02/2012 - Low risk (order in to repeat )   Colonoscopy (discussing with [employer])  Canceled due to copay and is looking for doctor who does one in the office   Prevnar 13 taken, PSV 23 given today in clinic   Abnormal Screens  Educated regarding pre diabetes; diet and exercise   Referrals  Provided edu regarding AD and given referral to [facility] as needed   Patient concerns; As noted  Nurse Concerns; As noted   Next PCP apt Was seen today       I have personally reviewed and noted the following in the patient's chart:   . Medical and social history . Use of alcohol, tobacco or illicit drugs  . Current medications and supplements . Functional ability and status . Nutritional status . Physical activity . Advanced directives . List of other physicians . Hospitalizations, surgeries, and ER visits in previous 12 months . Vitals . Screenings to include cognitive, depression, and falls . Referrals and appointments  In addition, I have reviewed and discussed with patient certain preventive protocols, quality metrics,  and best practice recommendations. A written personalized care plan for preventive services as well as general preventive health recommendations were provided to patient.     Wynetta Fines, RN   10/12/2017

## 2017-10-12 ENCOUNTER — Encounter: Payer: Self-pay | Admitting: Family Medicine

## 2017-10-12 ENCOUNTER — Ambulatory Visit: Payer: Medicare Other

## 2017-10-12 ENCOUNTER — Ambulatory Visit: Payer: Medicare Other | Admitting: Family Medicine

## 2017-10-12 VITALS — BP 126/82 | HR 67 | Ht 62.0 in | Wt 173.0 lb

## 2017-10-12 VITALS — BP 126/82 | HR 67 | Temp 98.1°F | Resp 12 | Ht 62.0 in | Wt 173.2 lb

## 2017-10-12 DIAGNOSIS — Z23 Encounter for immunization: Secondary | ICD-10-CM | POA: Diagnosis not present

## 2017-10-12 DIAGNOSIS — Z9189 Other specified personal risk factors, not elsewhere classified: Secondary | ICD-10-CM | POA: Diagnosis not present

## 2017-10-12 DIAGNOSIS — E6609 Other obesity due to excess calories: Secondary | ICD-10-CM | POA: Diagnosis not present

## 2017-10-12 DIAGNOSIS — Z1211 Encounter for screening for malignant neoplasm of colon: Secondary | ICD-10-CM | POA: Diagnosis not present

## 2017-10-12 DIAGNOSIS — N3941 Urge incontinence: Secondary | ICD-10-CM

## 2017-10-12 DIAGNOSIS — E785 Hyperlipidemia, unspecified: Secondary | ICD-10-CM | POA: Diagnosis not present

## 2017-10-12 DIAGNOSIS — K219 Gastro-esophageal reflux disease without esophagitis: Secondary | ICD-10-CM | POA: Diagnosis not present

## 2017-10-12 DIAGNOSIS — Z780 Asymptomatic menopausal state: Secondary | ICD-10-CM | POA: Diagnosis not present

## 2017-10-12 DIAGNOSIS — L678 Other hair color and hair shaft abnormalities: Secondary | ICD-10-CM | POA: Diagnosis not present

## 2017-10-12 DIAGNOSIS — Z0000 Encounter for general adult medical examination without abnormal findings: Secondary | ICD-10-CM

## 2017-10-12 DIAGNOSIS — Z1159 Encounter for screening for other viral diseases: Secondary | ICD-10-CM | POA: Diagnosis not present

## 2017-10-12 DIAGNOSIS — Z6831 Body mass index (BMI) 31.0-31.9, adult: Secondary | ICD-10-CM | POA: Diagnosis not present

## 2017-10-12 DIAGNOSIS — I10 Essential (primary) hypertension: Secondary | ICD-10-CM | POA: Diagnosis not present

## 2017-10-12 MED ORDER — ZOSTER VAC RECOMB ADJUVANTED 50 MCG/0.5ML IM SUSR: INTRAMUSCULAR | 1 refills | Status: DC

## 2017-10-12 MED ORDER — TRIAMTERENE-HCTZ 37.5-25 MG PO TABS: 1.0000 | ORAL_TABLET | Freq: Every day | ORAL | 3 refills | Status: DC

## 2017-10-12 MED ORDER — EFLORNITHINE HCL 13.9 % EX CREA: 1.0000 "application " | TOPICAL_CREAM | Freq: Two times a day (BID) | CUTANEOUS | 1 refills | Status: DC

## 2017-10-12 NOTE — Assessment & Plan Note (Signed)
Well-controlled on omeprazole 40 mg daily. We discussed some side effects of PPIs GERD precautions also recommended. Follow-up in 12 months, before if needed.

## 2017-10-12 NOTE — Patient Instructions (Addendum)
Latasha Young , Thank you for taking time to come for your Medicare Wellness Visit. I appreciate your ongoing commitment to your health goals. Please review the following plan we discussed and let me know if I can assist you in the futu  Educated regarding prediabetes and numbers;  A1c ranges from 5.8 to 6.5 or fasting Blood sugar > 115 -126; (126 is diabetic)   Risk: >69yo; family hx; overweight or obese; African American; Hispanic; Latino; American [place]; [place] American; [place] Islander; history of diabetes when pregnant; or birth to a baby weighing over 9 lbs. Being less physically active than 30 minutes; 3 times a week;   Prevention; Losing a modest 7 to 8 lbs; If over 200 lbs; 10 to 14 lbs;  Choose healthier foods; colorful veggies; fish or lean meats; drinks water Reduce portion size Start exercising; 30 minutes of fast walking x 30 minutes per day/ 60 min for weight loss   A Tetanus is recommended every 10 years. Medicare covers a tetanus if you have a cut or wound; otherwise, there may be a charge. If you had not had a tetanus with pertusses, known as the Tdap, you can take this anytime.   Colonoscopy - trying to find a physician that has an office based exam      This is a list of the screening recommended for you and due dates:  Health Maintenance  Topic Date Due  .  Hepatitis C: One time screening is recommended by Center for Disease Control  (CDC) for  adults born from [birth year] through 1967.   1951-03-24  . Tetanus Vaccine  01/11/1968  . Colon Cancer Screening  01/11/1999  . Pneumonia vaccines (1 of 2 - PCV13) 01/10/2014  . Flu Shot  11/03/2017  . Mammogram  02/02/2019  . DEXA scan (bone density measurement)  Completed   Summary: Preventive Care for Adults  A healthy lifestyle and preventive care can promote health and wellness. Preventive health guidelines for adults include the following key practices.  . A routine yearly physical is a good way to check with your  health care provider about your health and preventive screening. It is a chance to share any concerns and updates on your health and to receive a thorough exam.  . Visit your dentist for a routine exam and preventive care every 6 months. Brush your teeth twice a day and floss once a day. Good oral hygiene prevents tooth decay and gum disease.  . The frequency of eye exams is based on your age, health, family medical history, use  of contact lenses, and other factors. Follow your health care provider's ecommendations for frequency of eye exams.  . Eat a healthy diet. Foods like vegetables, fruits, whole grains, low-fat dairy products, and lean protein foods contain the nutrients you need without too many calories. Decrease your intake of foods high in solid fats, added sugars, and salt. Eat the right amount of calories for you. Get information about a proper diet from your health care provider, if necessary.  . Regular physical exercise is one of the most important things you can do for your health. Most adults should get at least 150 minutes of moderate-intensity exercise (any activity that increases your heart rate and causes you to sweat) each week. In addition, most adults need muscle-strengthening exercises on 2 or more days a week.  Silver Sneakers may be a benefit available to you. To determine eligibility, you may visit the website: www.silversneakers.com or contact program at 669-513-8262  Mon-Fri between 8AM-8PM.   . Maintain a healthy weight. The body mass index (BMI) is a screening tool to identify possible weight problems. It provides an estimate of body fat based on height and weight. Your health care provider can find your BMI and can help you achieve or maintain a healthy weight.   For adults 20 years and older: ? A BMI below 18.5 is considered underweight. ? A BMI of 18.5 to 24.9 is normal. ? A BMI of 27 to 28 is considered normal by the Institutes of Health  ? A BMI of 30  and above is considered obese.   . Maintain normal blood lipids and cholesterol levels by exercising and minimizing your intake of saturated fat. Eat a balanced diet with plenty of fruit and vegetables. Blood tests for lipids and cholesterol should begin at age 55 and be repeated every 5 years. If your lipid or cholesterol levels are high, you are over 50, or you are at high risk for heart disease, you may need your cholesterol levels checked more frequently. Ongoing high lipid and cholesterol levels should be treated with medicines if diet and exercise are not working.  . If you smoke, find out from your health care provider how to quit. If you do not use tobacco, please do not start.  . If you choose to drink alcohol, please do not consume more than one drink for women and 2 for men.  One drink is considered to be 12 ounces (355 mL) of beer, 5 ounces (148 mL) of wine, or 1.5 ounces (44 mL) of liquor. Moderation of alcohol intake to this level decreases your risk of breast cancer and liver damage.   . If you are 41-74 years old, ask your health care provider if you should take aspirin to prevent strokes.  . Use sunscreen. Apply sunscreen liberally and repeatedly throughout the day. You should seek shade when your shadow is shorter than you. Protect yourself by wearing long sleeves, pants, a wide-brimmed hat, and sunglasses year round, whenever you are outdoors.  . Once a month, do a whole body skin exam, using a mirror to look at the skin on your back. Tell your health care provider of new moles, moles that have irregular borders, moles that are larger than a pencil eraser, or moles that have changed in shape or color.  Last, if you have completed an Advanced Directive; please bring a copy and review with your physician and then we will scan to the medical record       Fall Prevention in the Home Falls can cause injuries and can affect people from all age groups. There are many  simple things that you can do to make your home safe and to help prevent falls. What can I do on the outside of my home?  Regularly repair the edges of walkways and driveways and fix any cracks.  Remove high doorway thresholds.  Trim any shrubbery on the main path into your home.  Use bright outdoor lighting.  Clear walkways of debris and clutter, including tools and rocks.  Regularly check that handrails are securely fastened and in good repair. Both sides of any steps should have handrails.  Install guardrails along the edges of any raised decks or porches.  Have leaves, snow, and ice cleared regularly.  Use sand or salt on walkways during winter months.  In the garage, clean up any spills right away, including grease or oil spills. What can I do in the  bathroom?  Use night lights.  Install grab bars by the toilet and in the tub and shower. Do not use towel bars as grab bars.  Use non-skid mats or decals on the floor of the tub or shower.  If you need to sit down while you are in the shower, use a plastic, non-slip stool.  Keep the floor dry. Immediately clean up any water that spills on the floor.  Remove soap buildup in the tub or shower on a regular basis.  Attach bath mats securely with double-sided non-slip rug tape.  Remove throw rugs and other tripping hazards from the floor. What can I do in the bedroom?  Use night lights.  Make sure that a bedside light is easy to reach.  Do not use oversized bedding that drapes onto the floor.  Have a firm chair that has side arms to use for getting dressed.  Remove throw rugs and other tripping hazards from the floor. What can I do in the kitchen?  Clean up any spills right away.  Avoid walking on wet floors.  Place frequently used items in easy-to-reach places.  If you need to reach for something above you, use a sturdy step stool that has a grab bar.  Keep electrical cables out of the way.  Do not use floor  polish or wax that makes floors slippery. If you have to use wax, make sure that it is non-skid floor wax.  Remove throw rugs and other tripping hazards from the floor. What can I do in the stairways?  Do not leave any items on the stairs.  Make sure that there are handrails on both sides of the stairs. Fix handrails that are broken or loose. Make sure that handrails are as long as the stairways.  Check any carpeting to make sure that it is firmly attached to the stairs. Fix any carpet that is loose or worn.  Avoid having throw rugs at the top or bottom of stairways, or secure the rugs with carpet tape to prevent them from moving.  Make sure that you have a light switch at the top of the stairs and the bottom of the stairs. If you do not have them, have them installed. What are some other fall prevention tips?  Wear closed-toe shoes that fit well and support your feet. Wear shoes that have rubber soles or low heels.  When you use a stepladder, make sure that it is completely opened and that the sides are firmly locked. Have someone hold the ladder while you are using it. Do not climb a closed stepladder.  Add color or contrast paint or tape to grab bars and handrails in your home. Place contrasting color strips on the first and last steps.  Use mobility aids as needed, such as canes, walkers, scooters, and crutches.  Turn on lights if it is dark. Replace any light bulbs that burn out.  Set up furniture so that there are clear paths. Keep the furniture in the same spot.  Fix any uneven floor surfaces.  Choose a carpet design that does not hide the edge of steps of a stairway.  Be aware of any and all pets.  Review your medicines with your healthcare provider. Some medicines can cause dizziness or changes in blood pressure, which increase your risk of falling. Talk with your health care provider about other ways that you can decrease your risk of falls. This may include working with  a physical therapist or trainer to improve  your strength, balance, and endurance. This information is not intended to replace advice given to you by your health care provider. Make sure you discuss any questions you have with your health care provider. Document Released: 03/12/2002 Document Revised: 08/19/2015 Document Reviewed: 04/26/2014 Elsevier Interactive Patient Education  [employer].

## 2017-10-12 NOTE — Assessment & Plan Note (Signed)
Encouraged to continue a healthy diet and regular physical activity. We discussed benefits of weight loss in regard to disease prevention.

## 2017-10-12 NOTE — Assessment & Plan Note (Signed)
Adequately controlled. No changes in current management. Continue low salt diet. Eye exam recommended annually. F/U in 6 months, before if needed.  

## 2017-10-12 NOTE — Assessment & Plan Note (Signed)
Chronic, most likely related to menopause. We discussed treatment options, she would like to try topical treatment.  She understands treatment may not be covered by her health insurance.

## 2017-10-12 NOTE — Assessment & Plan Note (Signed)
She is not fasting today, so she will be back in 2 days for fasting labs. She will continue atorvastatin 40 mg daily. Continue low-fat diet. Follow-up in 6 to 12 months depending on lipid panel results.

## 2017-10-12 NOTE — Progress Notes (Signed)
HPI:   [name] is a 67 y.o. female, who is here today for 6 months follow up.   Last follow-up in 12/2016.  She was last seen on 07/04/2017 for acute visit.   She follows with Dr. Loanne Drilling for multinodular goiter.   GERD: She is Omeprazole daily at night. She has no heartburn as far as she takes medication daily.  Denies abdominal pain, nausea, vomiting, changes in bowel habits, blood in stool or melena.  Hypertension:   Currently on lisinopril 40 mg, Maxide 37.5-25 mg daily, and metoprolol succinate 25 mg daily.  She is taking medications as instructed, no side effects reported. Home BP 130s/80s. She has not noted unusual headache, she has mild headache today.  Last time she had a headache was a month ago. No associated nausea, vomiting, or visual changes.  Negative for exertional chest pain, dyspnea,  focal weakness, or edema. Last eye exam less than a year ago.   Lab Results  Component Value Date   CREATININE 1.01 12/24/2016   BUN 16 12/24/2016   NA 135 12/24/2016   K 3.8 12/24/2016   CL 96 12/24/2016   CO2 33 (H) 12/24/2016    Hyperlipidemia:  Currently on Lipitor 40 mg daily, she skips medication when she is traveling. Following a low fat diet: She has been more consistent with her diet for the past 6 weeks..  She has not noted side effects with medication.  Lab Results  Component Value Date   CHOL 207 (H) 12/24/2016   HDL 38.40 (L) 12/24/2016   LDLCALC 132 (H) 12/24/2016   TRIG 186.0 (H) 12/24/2016   CHOLHDL 5 12/24/2016    She also wants to discuss health maintenance, she has an appointment for Medicare visit today. Last colon cancer screening in 2006, she had sigmoidoscopy.  She has not had colonoscopy because of cost. She would like to have it with provider that does procedure in office because it would be cheaper. She has not had zoster vaccine.  DEXA in 02/2012. Prevnar 13 in 2017. She thinks she may be due for Tdap. She has  not had HCV screening. Last mammogram in 01/2017.  Since her last visit she has changed her diet, she has been exercising regularly with a trainer 3 times per week for the past 6 weeks. She decreased sugar intake.   Today she is also complaining about 2 weeks of urinary frequency, urgency, and mild incontinence. Nocturia x 2. Negative for abdominal pain, dysuria, gross hematuria, or decreased urine output.  Facial hair: She is also requesting treatment for facial hair.  She has tried all other hair removal treatments,pulling hair; but she feels like it is not helping and it is irritating her skin. Problem has been going on for years and seems to be getting worse.   Review of Systems  Constitutional: Negative for activity change, appetite change, fatigue, fever and unexpected weight change.  HENT: Negative for mouth sores, nosebleeds and trouble swallowing.   Eyes: Negative for redness and visual disturbance.  Respiratory: Negative for cough, shortness of breath and wheezing.   Cardiovascular: Negative for chest pain, palpitations and leg swelling.  Gastrointestinal: Negative for abdominal pain, nausea and vomiting.       Negative for changes in bowel habits.  Genitourinary: Negative for decreased urine volume, dysuria and hematuria.  Musculoskeletal: Negative for gait problem and myalgias.  Skin: Negative for rash.  Neurological: Positive for headaches. Negative for syncope, facial asymmetry and weakness.  Psychiatric/Behavioral: Negative for confusion. The patient is not nervous/anxious.      Current Outpatient Medications on File Prior to Visit  Medication Sig Dispense Refill  . atorvastatin (LIPITOR) 40 MG tablet Take 1 tablet (40 mg total) by mouth daily. 90 tablet 1  . Cholecalciferol (VITAMIN D3) 50000 units TABS Take by mouth once a week.    . fluticasone (FLONASE) 50 MCG/ACT nasal spray Place 1 spray into both nostrils 2 (two) times daily. 16 g 6  . lisinopril  (PRINIVIL,ZESTRIL) 40 MG tablet TAKE 1 TABLET BY MOUTH DAILY 90 tablet 1  . metoprolol succinate (TOPROL-XL) 25 MG 24 hr tablet TAKE 1 TABLET BY MOUTH DAILY 90 tablet 2  . Multiple Vitamins-Minerals (MULTIVITAMIN ADULT PO) Take by mouth daily.    . Na Sulfate-K Sulfate-Mg Sulf (SUPREP BOWEL PREP KIT) 17.5-3.13-1.6 GM/177ML SOLN Suprep as directed, no substitutions 354 mL 0  . omeprazole (PRILOSEC) 40 MG capsule TAKE 1 CAPSULE BY MOUTH DAILY BEFORE  BREAKFAST (Patient taking differently: TAKE 1 CAPSULE BY MOUTH DAILY) 30 capsule 3   No current facility-administered medications on file prior to visit.      Past Medical History:  Diagnosis Date  . Allergy   . Cataract    "beginnings of"  . GERD (gastroesophageal reflux disease)   . Hyperlipidemia   . Hypertension   . Thyroid disease    2 benign nodules noted- check yearly  . Vitamin D deficiency    Allergies  Allergen Reactions  . Penicillins Hives    Social History   Socioeconomic History  . Marital status: Divorced    Spouse name: Not on file  . Number of children: Not on file  . Years of education: Not on file  . Highest education level: Not on file  Occupational History  . Not on file  Social Needs  . Financial resource strain: Not on file  . Food insecurity:    Worry: Not on file    Inability: Not on file  . Transportation needs:    Medical: Not on file    Non-medical: Not on file  Tobacco Use  . Smoking status: Never Smoker  . Smokeless tobacco: Never Used  Substance and Sexual Activity  . Alcohol use: Yes    Comment: Occ  . Drug use: No  . Sexual activity: Not on file  Lifestyle  . Physical activity:    Days per week: Not on file    Minutes per session: Not on file  . Stress: Not on file  Relationships  . Social connections:    Talks on phone: Not on file    Gets together: Not on file    Attends religious service: Not on file    Active member of club or organization: Not on file    Attends meetings  of clubs or organizations: Not on file    Relationship status: Not on file  Other Topics Concern  . Not on file  Social History Narrative  . Not on file    Vitals:   10/12/17 0827  BP: 126/82  Pulse: 67  Resp: 12  Temp: 98.1 F (36.7 C)  SpO2: 98%   Body mass index is 31.69 kg/m.  Physical Exam  Nursing note and vitals reviewed. Constitutional: She is oriented to person, place, and time. She appears well-developed. No distress.  HENT:  Head: Normocephalic and atraumatic.  Mouth/Throat: Oropharynx is clear and moist and mucous membranes are normal.  Eyes: Pupils are equal, round, and reactive to  light. Conjunctivae are normal.  Neck: No tracheal deviation present. Thyromegaly present.  Cardiovascular: Normal rate and regular rhythm.  No murmur heard. Pulses:      Dorsalis pedis pulses are 2+ on the right side, and 2+ on the left side.  Respiratory: Effort normal and breath sounds normal. No respiratory distress.  GI: Soft. She exhibits no mass. There is no hepatomegaly. There is no tenderness.  Musculoskeletal: She exhibits no edema.  Lymphadenopathy:    She has no cervical adenopathy.  Neurological: She is alert and oriented to person, place, and time. She has normal strength. No cranial nerve deficit. Gait normal.  Skin: Skin is warm. No erythema.  Facial hair on chin and some along jaw, bilateral.Scattered papular ,non erythematous lesions.  Psychiatric: She has a normal mood and affect.  Well groomed, good eye contact.     ASSESSMENT AND PLAN:   [name] was seen today for 6 months follow-up.  Orders Placed This Encounter  Procedures  . DG Bone Density  . Pneumococcal polysaccharide vaccine 23-valent greater than or equal to 2yo subcutaneous/IM  . Comprehensive metabolic panel  . Lipid panel  . Hepatitis C antibody  . Urinalysis, Routine w reflex microscopic  . Ambulatory referral to Gastroenterology    Urgency incontinence  We discussed  possible etiologies,?  Overactive bladder. [place] exercises are recommended for now as well as pelvic floor exercises. For now we will hold on pharmacologic treatment. Further recommendations will be given according to UA results.  - Urinalysis, Routine w reflex microscopic; Future   Class 1 obesity with body mass index (BMI) of 31.0 to 31.9 in adult Encouraged to continue a healthy diet and regular physical activity. We discussed benefits of weight loss in regard to disease prevention.   Hyperlipidemia She is not fasting today, so she will be back in 2 days for fasting labs. She will continue atorvastatin 40 mg daily. Continue low-fat diet. Follow-up in 6 to 12 months depending on lipid panel results.  Hypertension, essential, benign Adequately controlled. No changes in current management. Continue low-salt diet. Eye exam recommended annually. F/U in 6 months, before if needed.   GERD (gastroesophageal reflux disease) Well-controlled on omeprazole 40 mg daily. We discussed some side effects of PPIs GERD precautions also recommended. Follow-up in 12 months, before if needed.  Abnormal facial hair Chronic, most likely related to menopause. We discussed treatment options, she would like to try topical treatment.  She understands treatment may not be covered by her health insurance.   Encounter for HCV screening test for high risk patient - Hepatitis C antibody; Future  Colon cancer screening - Ambulatory referral to Gastroenterology  Asymptomatic postmenopausal estrogen deficiency - DG Bone Density; Future  Need for 23-polyvalent pneumococcal polysaccharide vaccine - Pneumococcal polysaccharide vaccine 23-valent greater than or equal to 2yo subcutaneous/IM    Latecia Miler G. [place], MD  [facility] [place] office.

## 2017-10-12 NOTE — Patient Instructions (Addendum)
A few things to remember from today's visit:   Hypertension, essential, benign - Plan: Comprehensive metabolic panel, triamterene-hydrochlorothiazide (MAXZIDE-25) 37.5-25 MG tablet  Hyperlipidemia, unspecified hyperlipidemia type - Plan: Lipid panel  Gastroesophageal reflux disease, esophagitis presence not specified  Urgency incontinence - Plan: Urinalysis, Routine w reflex microscopic  Encounter for HCV screening test for high risk patient - Plan: Hepatitis C antibody  Colon cancer screening - Plan: Ambulatory referral to Gastroenterology  Asymptomatic postmenopausal estrogen deficiency - Plan: DG Bone Density   Please be sure medication list is accurate. If a new problem present, please set up appointment sooner than planned today.

## 2017-10-14 ENCOUNTER — Other Ambulatory Visit (INDEPENDENT_AMBULATORY_CARE_PROVIDER_SITE_OTHER): Payer: Medicare Other

## 2017-10-14 DIAGNOSIS — E785 Hyperlipidemia, unspecified: Secondary | ICD-10-CM

## 2017-10-14 DIAGNOSIS — I10 Essential (primary) hypertension: Secondary | ICD-10-CM

## 2017-10-14 DIAGNOSIS — N3941 Urge incontinence: Secondary | ICD-10-CM | POA: Diagnosis not present

## 2017-10-14 DIAGNOSIS — Z1159 Encounter for screening for other viral diseases: Secondary | ICD-10-CM

## 2017-10-14 DIAGNOSIS — Z9189 Other specified personal risk factors, not elsewhere classified: Secondary | ICD-10-CM | POA: Diagnosis not present

## 2017-10-14 LAB — URINALYSIS, ROUTINE W REFLEX MICROSCOPIC
Bilirubin Urine: NEGATIVE
HGB URINE DIPSTICK: NEGATIVE
Ketones, ur: NEGATIVE
Leukocytes, UA: NEGATIVE
NITRITE: NEGATIVE
RBC / HPF: NONE SEEN (ref 0–?)
SPECIFIC GRAVITY, URINE: 1.01 (ref 1.000–1.030)
Total Protein, Urine: NEGATIVE
Urine Glucose: NEGATIVE
Urobilinogen, UA: 0.2 (ref 0.0–1.0)
pH: 7 (ref 5.0–8.0)

## 2017-10-14 LAB — COMPREHENSIVE METABOLIC PANEL
ALBUMIN: 4.3 g/dL (ref 3.5–5.2)
ALK PHOS: 70 U/L (ref 39–117)
ALT: 14 U/L (ref 0–35)
AST: 15 U/L (ref 0–37)
BUN: 10 mg/dL (ref 6–23)
CALCIUM: 9.2 mg/dL (ref 8.4–10.5)
CO2: 27 mEq/L (ref 19–32)
Chloride: 101 mEq/L (ref 96–112)
Creatinine, Ser: 0.98 mg/dL (ref 0.40–1.20)
GFR: 72.42 mL/min (ref >60.00)
Glucose, Bld: 77 mg/dL (ref 70–99)
POTASSIUM: 3.9 meq/L (ref 3.5–5.1)
Sodium: 138 mEq/L (ref 135–145)
TOTAL PROTEIN: 6.7 g/dL (ref 6.0–8.3)
Total Bilirubin: 1.9 mg/dL — ABNORMAL HIGH (ref 0.2–1.2)

## 2017-10-14 LAB — LIPID PANEL
CHOLESTEROL: 169 mg/dL (ref 0–200)
HDL: 37.9 mg/dL — AB (ref >39.00)
LDL Cholesterol: 110 mg/dL — ABNORMAL HIGH (ref 0–99)
NonHDL: 131.36
TRIGLYCERIDES: 106 mg/dL (ref 0.0–149.0)
Total CHOL/HDL Ratio: 4
VLDL: 21.2 mg/dL (ref 0.0–40.0)

## 2017-10-14 NOTE — Progress Notes (Signed)
I have reviewed documentation from this visit and I agree with recommendations given.  Corrisa Gibby G. Crosby Oriordan, MD  [facility]. [place] office.   

## 2017-10-15 LAB — HEPATITIS C ANTIBODY
Hepatitis C Ab: NONREACTIVE
SIGNAL TO CUT-OFF: 0.01 (ref <1.00)

## 2017-10-18 ENCOUNTER — Encounter: Payer: Self-pay | Admitting: Family Medicine

## 2017-11-08 ENCOUNTER — Other Ambulatory Visit: Payer: Self-pay | Admitting: Family Medicine

## 2017-11-08 DIAGNOSIS — Z1231 Encounter for screening mammogram for malignant neoplasm of breast: Secondary | ICD-10-CM

## 2018-01-06 ENCOUNTER — Other Ambulatory Visit: Payer: Medicare Other

## 2018-01-06 ENCOUNTER — Ambulatory Visit: Payer: Medicare Other

## 2018-01-12 ENCOUNTER — Encounter: Payer: Self-pay | Admitting: Endocrinology

## 2018-01-12 ENCOUNTER — Ambulatory Visit: Payer: Medicare Other | Admitting: Endocrinology

## 2018-01-12 VITALS — BP 126/80 | HR 75 | Ht 62.0 in | Wt 166.8 lb

## 2018-01-12 DIAGNOSIS — Z23 Encounter for immunization: Secondary | ICD-10-CM | POA: Diagnosis not present

## 2018-01-12 DIAGNOSIS — E042 Nontoxic multinodular goiter: Secondary | ICD-10-CM

## 2018-01-12 LAB — TSH: TSH: 1.13 u[IU]/mL (ref 0.35–4.50)

## 2018-01-12 NOTE — Progress Notes (Signed)
Subjective:    Patient ID: Latasha Young, female    DOB: 1950-09-07, 67 y.o.   MRN: [id number]  HPI  Pt returns for f/u of multinodular thyroid (dx'ed 2018; bx then was: R: cat 2, and L: cat 1; she is euthyroid off rx).  pt states she feels well in general, except for slight fullness at the ant neck. Past Medical History:  Diagnosis Date  . Allergy   . Cataract    "beginnings of"  . GERD (gastroesophageal reflux disease)   . Hyperlipidemia   . Hypertension   . Thyroid disease    2 benign nodules noted- check yearly  . Vitamin D deficiency     Past Surgical History:  Procedure Laterality Date  . ABDOMINAL HYSTERECTOMY    . SIGMOIDOSCOPY    . WISDOM TOOTH EXTRACTION      Social History   Socioeconomic History  . Marital status: Divorced    Spouse name: Not on file  . Number of children: Not on file  . Years of education: Not on file  . Highest education level: Not on file  Occupational History  . Not on file  Social Needs  . Financial resource strain: Not on file  . Food insecurity:    Worry: Not on file    Inability: Not on file  . Transportation needs:    Medical: Not on file    Non-medical: Not on file  Tobacco Use  . Smoking status: Never Smoker  . Smokeless tobacco: Never Used  Substance and Sexual Activity  . Alcohol use: Yes    Comment: Occ  . Drug use: No  . Sexual activity: Not on file  Lifestyle  . Physical activity:    Days per week: Not on file    Minutes per session: Not on file  . Stress: Not on file  Relationships  . Social connections:    Talks on phone: Not on file    Gets together: Not on file    Attends religious service: Not on file    Active member of club or organization: Not on file    Attends meetings of clubs or organizations: Not on file    Relationship status: Not on file  . Intimate partner violence:    Fear of current or ex partner: Not on file    Emotionally abused: Not on file    Physically abused: Not on file   Forced sexual activity: Not on file  Other Topics Concern  . Not on file  Social History Narrative  . Not on file    Current Outpatient Medications on File Prior to Visit  Medication Sig Dispense Refill  . atorvastatin (LIPITOR) 40 MG tablet Take 1 tablet (40 mg total) by mouth daily. 90 tablet 1  . Cholecalciferol (VITAMIN D3) 50000 units TABS Take by mouth once a week.    Marland Kitchen lisinopril (PRINIVIL,ZESTRIL) 40 MG tablet TAKE 1 TABLET BY MOUTH DAILY 90 tablet 1  . metoprolol succinate (TOPROL-XL) 25 MG 24 hr tablet TAKE 1 TABLET BY MOUTH DAILY 90 tablet 2  . Multiple Vitamins-Minerals (MULTIVITAMIN ADULT PO) Take by mouth daily.    Marland Kitchen omeprazole (PRILOSEC) 40 MG capsule TAKE 1 CAPSULE BY MOUTH DAILY BEFORE  BREAKFAST (Patient taking differently: Take 40 mg by mouth as needed. ) 30 capsule 3  . triamterene-hydrochlorothiazide (MAXZIDE-25) 37.5-25 MG tablet Take 1 tablet by mouth daily. 90 tablet 3   No current facility-administered medications on file prior to visit.  Allergies  Allergen Reactions  . Penicillins Hives    Family History  Problem Relation Age of Onset  . Stroke Mother   . Hypertension Mother   . Stroke Brother   . Hypertension Maternal Grandmother   . Hypertension Maternal Grandfather   . Alcohol abuse Brother   . Hypertension Brother   . Stroke Brother   . Thyroid disease Neg Hx   . Colon cancer Neg Hx   . Esophageal cancer Neg Hx   . Rectal cancer Neg Hx   . Stomach cancer Neg Hx     BP 126/80 (BP Location: Right Arm)   Pulse 75   Ht 5\' 2"  (1.575 m)   Wt 166 lb 12.8 oz (75.7 kg)   SpO2 98%   BMI 30.51 kg/m    Review of Systems Denies neck pain.     Objective:   Physical Exam VITAL SIGNS:  See vs page GENERAL: no distress NECK: multinodular goiter is again noted  Lab Results  Component Value Date   TSH 1.13 01/12/2018      Assessment & Plan:  Multinodular goiter: due for recheck Fullness at the ant neck: we discussed that f/u US is how  we exclude malignancy with greater certainty than the bx only  Patient Instructions  blood tests are requested for you today.  We'll let you know about the results. Let's recheck the ultrasound.  you will receive a phone call, about a day and time for an appointment. If these are unchanged, please come back for a follow-up appointment in 1 year.

## 2018-01-12 NOTE — Patient Instructions (Signed)
blood tests are requested for you today.  We'll let you know about the results. Let's recheck the ultrasound.  you will receive a phone call, about a day and time for an appointment. If these are unchanged, please come back for a follow-up appointment in 1 year.

## 2018-01-19 ENCOUNTER — Ambulatory Visit
Admission: RE | Admit: 2018-01-19 | Discharge: 2018-01-19 | Disposition: A | Discharge status: Home / Self Care | Payer: Medicare Other | Source: Ambulatory Visit | Attending: Endocrinology | Admitting: Endocrinology

## 2018-01-19 DIAGNOSIS — E042 Nontoxic multinodular goiter: Secondary | ICD-10-CM

## 2018-02-01 ENCOUNTER — Encounter: Payer: Self-pay | Admitting: Family Medicine

## 2018-02-04 ENCOUNTER — Other Ambulatory Visit: Payer: Self-pay | Admitting: Family Medicine

## 2018-02-04 DIAGNOSIS — I10 Essential (primary) hypertension: Secondary | ICD-10-CM

## 2018-02-23 ENCOUNTER — Ambulatory Visit
Admission: RE | Admit: 2018-02-23 | Discharge: 2018-02-23 | Disposition: A | Discharge status: Home / Self Care | Payer: Medicare Other | Source: Ambulatory Visit | Attending: Family Medicine | Admitting: Family Medicine

## 2018-02-23 ENCOUNTER — Other Ambulatory Visit: Payer: Self-pay | Admitting: Family Medicine

## 2018-02-23 DIAGNOSIS — Z1231 Encounter for screening mammogram for malignant neoplasm of breast: Secondary | ICD-10-CM | POA: Diagnosis not present

## 2018-02-23 DIAGNOSIS — Z13820 Encounter for screening for osteoporosis: Secondary | ICD-10-CM | POA: Diagnosis not present

## 2018-02-23 DIAGNOSIS — Z780 Asymptomatic menopausal state: Secondary | ICD-10-CM | POA: Diagnosis not present

## 2018-02-23 DIAGNOSIS — I10 Essential (primary) hypertension: Secondary | ICD-10-CM

## 2018-02-28 ENCOUNTER — Encounter: Payer: Self-pay | Admitting: Family Medicine

## 2018-03-08 ENCOUNTER — Ambulatory Visit: Payer: Medicare Other | Admitting: Family Medicine

## 2018-03-08 ENCOUNTER — Encounter: Payer: Self-pay | Admitting: Family Medicine

## 2018-03-08 VITALS — BP 125/80 | HR 83 | Temp 98.8°F | Resp 12 | Ht 62.0 in | Wt 167.2 lb

## 2018-03-08 DIAGNOSIS — R1030 Lower abdominal pain, unspecified: Secondary | ICD-10-CM | POA: Diagnosis not present

## 2018-03-08 DIAGNOSIS — M545 Low back pain, unspecified: Secondary | ICD-10-CM

## 2018-03-08 DIAGNOSIS — R197 Diarrhea, unspecified: Secondary | ICD-10-CM | POA: Diagnosis not present

## 2018-03-08 DIAGNOSIS — Z1211 Encounter for screening for malignant neoplasm of colon: Secondary | ICD-10-CM

## 2018-03-08 NOTE — Progress Notes (Signed)
ACUTE VISIT   HPI:  Chief Complaint  Patient presents with  . Abdominal Pain    off and on as of yesterday, was constant 1 week ago  . Constipation    started 01/29/18, took milk of mag Monday night    Latasha Young is a 67 y.o. female, who is here today complaining of lower abdominal cramp pain that started right after eating in a local restaurant.  One of her friends ate the same meal but she did not develop symptoms.  Diarrhea that lasted a few days, small stools and rectal tenesmus. + Urge but not able to have a bowel movement.She states that she was able to have a stool 2 to 3 days ago after she took milk of magnesium,which caused diarrhea. Last bowel movement yesterday around 3 pm. She denies dyschezia or hematochezia.    Initially she had bloating sensation but has improved.  Abdominal pain is exacerbated by food intake and alleviated by passing gas and defecation. It is slowly getting better.Now she has constant pressure like sensation,mild.   Lower back pain back pain that started around the same time, she is not sure if this was related to abdominal pain but it has improved. Negative for associated fever, chills, nausea, vomiting, melena, or urinary symptoms.  She is also requesting referral to see Dr Jeani Hawking, GI, for her colonoscopy.   Review of Systems  Constitutional: Positive for appetite change and fatigue. Negative for activity change and fever.  HENT: Negative for mouth sores, sore throat and trouble swallowing.   Respiratory: Negative for cough, shortness of breath and wheezing.   Gastrointestinal: Positive for abdominal pain and diarrhea. Negative for abdominal distention, blood in stool, nausea, rectal pain and vomiting.  Endocrine: Negative for cold intolerance and heat intolerance.  Genitourinary: Negative for dysuria, frequency, hematuria, vaginal bleeding and vaginal discharge.  Musculoskeletal: Positive for back pain. Negative for  arthralgias and gait problem.  Skin: Negative for pallor and rash.  Neurological: Negative for weakness and headaches.  Hematological: Negative for adenopathy. Does not bruise/bleed easily.  Psychiatric/Behavioral: Negative for confusion. The patient is nervous/anxious.       Current Outpatient Medications on File Prior to Visit  Medication Sig Dispense Refill  . atorvastatin (LIPITOR) 40 MG tablet Take 1 tablet (40 mg total) by mouth daily. 90 tablet 1  . Cholecalciferol (VITAMIN D3) 50000 units TABS Take by mouth once a week.    Marland Kitchen lisinopril (PRINIVIL,ZESTRIL) 40 MG tablet TAKE 1 TABLET BY MOUTH ONCE DAILY 90 tablet 1  . metoprolol succinate (TOPROL-XL) 25 MG 24 hr tablet TAKE 1 TABLET BY MOUTH ONCE DAILY 90 tablet 2  . Multiple Vitamins-Minerals (MULTIVITAMIN ADULT PO) Take by mouth daily.    Marland Kitchen omeprazole (PRILOSEC) 40 MG capsule TAKE 1 CAPSULE BY MOUTH DAILY BEFORE  BREAKFAST (Patient taking differently: Take 40 mg by mouth as needed. ) 30 capsule 3  . triamterene-hydrochlorothiazide (MAXZIDE-25) 37.5-25 MG tablet Take 1 tablet by mouth daily. 90 tablet 3   No current facility-administered medications on file prior to visit.      Past Medical History:  Diagnosis Date  . Allergy   . Cataract    "beginnings of"  . GERD (gastroesophageal reflux disease)   . Hyperlipidemia   . Hypertension   . Thyroid disease    2 benign nodules noted- check yearly  . Vitamin D deficiency    Allergies  Allergen Reactions  . Penicillins Hives    Social  History   Socioeconomic History  . Marital status: Divorced    Spouse name: Not on file  . Number of children: Not on file  . Years of education: Not on file  . Highest education level: Not on file  Occupational History  . Not on file  Social Needs  . Financial resource strain: Not on file  . Food insecurity:    Worry: Not on file    Inability: Not on file  . Transportation needs:    Medical: Not on file    Non-medical: Not on  file  Tobacco Use  . Smoking status: Never Smoker  . Smokeless tobacco: Never Used  Substance and Sexual Activity  . Alcohol use: Yes    Comment: Occ  . Drug use: No  . Sexual activity: Not on file  Lifestyle  . Physical activity:    Days per week: Not on file    Minutes per session: Not on file  . Stress: Not on file  Relationships  . Social connections:    Talks on phone: Not on file    Gets together: Not on file    Attends religious service: Not on file    Active member of club or organization: Not on file    Attends meetings of clubs or organizations: Not on file    Relationship status: Not on file  Other Topics Concern  . Not on file  Social History Narrative  . Not on file    Vitals:   03/08/18 1224  BP: 125/80  Pulse: 83  Resp: 12  Temp: 98.8 F (37.1 C)  SpO2: 98%   Body mass index is 30.59 kg/m.   Physical Exam  Nursing note and vitals reviewed. Constitutional: She is oriented to person, place, and time. She appears well-developed. She does not appear ill. No distress.  HENT:  Head: Normocephalic and atraumatic.  Mouth/Throat: Oropharynx is clear and moist and mucous membranes are normal.  Eyes: Conjunctivae are normal. No scleral icterus.  Cardiovascular: Normal rate and regular rhythm.  No murmur heard. Respiratory: Effort normal and breath sounds normal. No respiratory distress.  GI: Soft. Bowel sounds are normal. She exhibits no distension and no mass. There is no hepatomegaly. There is no tenderness.  Musculoskeletal: She exhibits no edema.       Lumbar back: She exhibits no tenderness and no bony tenderness.  Lymphadenopathy:    She has no cervical adenopathy.  Neurological: She is alert and oriented to person, place, and time. She has normal strength. Gait normal.  Skin: Skin is warm. No rash noted. No erythema.  Psychiatric: Her mood appears anxious.  Well groomed, good eye contact.      ASSESSMENT AND PLAN:  Latasha Young was seen today  for abdominal pain and constipation.  Diagnoses and all orders for this visit:  Lower abdominal pain Improving. Abdominal exam today otherwise negative. Bland diet and small meals at the time.  I do not think imaging or lab work is needed today. Clearly instructed about warning signs.  Diarrhea, unspecified type Initial episode seemed to be caused by indigestion,food poison less likely given the fact people who ate same meal did not developed symptoms. Diarrhea she is having now related to Milk of Mg. Recommend stopping Milk of Mg and to increased fiber intake. She can use Miralax and Bisacodyl 5 mg daily if needed. Adequate hydration. Instructed about warning signs.  Bilateral low back pain without sciatica, unspecified chronicity Resolved. F/U as needed.  Colon cancer  screening -     Ambulatory referral to Gastroenterology     Return if symptoms worsen or fail to improve.       G. [place], MD  [facility]. [place] office.

## 2018-03-08 NOTE — Patient Instructions (Addendum)
A few things to remember from today's visit:   Lower abdominal pain  Diarrhea, unspecified type  Plain and small meals. Adequate hydration.   Benefiber 1 or twice per day, MiraLAX daily as needed, and bisacodyl 5 mg daily as needed may help.  Please be sure medication list is accurate. If a new problem present, please set up appointment sooner than planned today.

## 2018-03-14 ENCOUNTER — Encounter: Payer: Self-pay | Admitting: Family Medicine

## 2018-03-15 ENCOUNTER — Other Ambulatory Visit: Payer: Self-pay | Admitting: Family Medicine

## 2018-03-15 DIAGNOSIS — R0789 Other chest pain: Secondary | ICD-10-CM

## 2018-04-12 DIAGNOSIS — K5900 Constipation, unspecified: Secondary | ICD-10-CM | POA: Diagnosis not present

## 2018-04-12 DIAGNOSIS — Z1211 Encounter for screening for malignant neoplasm of colon: Secondary | ICD-10-CM | POA: Diagnosis not present

## 2018-04-12 DIAGNOSIS — K219 Gastro-esophageal reflux disease without esophagitis: Secondary | ICD-10-CM | POA: Diagnosis not present

## 2018-04-17 ENCOUNTER — Encounter: Payer: Self-pay | Admitting: Interventional Cardiology

## 2018-04-17 ENCOUNTER — Ambulatory Visit: Payer: Medicare Other | Admitting: Interventional Cardiology

## 2018-04-17 VITALS — BP 150/98 | HR 69 | Ht 62.0 in | Wt 172.0 lb

## 2018-04-17 DIAGNOSIS — E782 Mixed hyperlipidemia: Secondary | ICD-10-CM | POA: Diagnosis not present

## 2018-04-17 DIAGNOSIS — I10 Essential (primary) hypertension: Secondary | ICD-10-CM

## 2018-04-17 DIAGNOSIS — R072 Precordial pain: Secondary | ICD-10-CM | POA: Diagnosis not present

## 2018-04-17 DIAGNOSIS — Z01812 Encounter for preprocedural laboratory examination: Secondary | ICD-10-CM

## 2018-04-17 NOTE — Progress Notes (Signed)
Cardiology Office Note   Date:  04/17/2018   ID:  Latasha Young, Latasha Young [birth year], MRN [id number]  PCP:  [place], Betty G, MD    No chief complaint on file.  Chest pain  Wt Readings from Last 3 Encounters:  04/17/18 172 lb (78 kg)  03/08/18 167 lb 4 oz (75.9 kg)  01/12/18 166 lb 12.8 oz (75.7 kg)       History of Present Illness:  Latasha Young is a 68 y.o. female who is being seen today for the evaluation of chest pain at the request of [place], Betty G, MD.  She had some intermittent chest pain in November and December 2019.  SHe had significant abdominal pain at that time.  She had left hand numbness and back pain as well.    Sx happen more at night.    She has not been exercising over the holiday.  Low back pain has been limiting.  Chest pain is not clearly related to walking.  She does have DOE.  She sweats easier with activity.   Denies :  Dizziness. Leg edema. Nitroglycerin use. Orthopnea. Palpitations. Paroxysmal nocturnal dyspnea. Shortness of breath. Syncope.   Family h/o CAD: Mother had stomach pains, but apparently died of an "aneurysm."  No early CAD.   Normal stress test a few years ago with Dr. Jacinto Halim.     Past Medical History:  Diagnosis Date  . Allergy   . Cataract    "beginnings of"  . GERD (gastroesophageal reflux disease)   . Hyperlipidemia   . Hypertension   . Thyroid disease    2 benign nodules noted- check yearly  . Vitamin D deficiency     Past Surgical History:  Procedure Laterality Date  . ABDOMINAL HYSTERECTOMY    . SIGMOIDOSCOPY    . WISDOM TOOTH EXTRACTION       Current Outpatient Medications  Medication Sig Dispense Refill  . atorvastatin (LIPITOR) 40 MG tablet Take 1 tablet (40 mg total) by mouth daily. 90 tablet 1  . Cholecalciferol (VITAMIN D3) 50000 units TABS Take by mouth once a week.    Marland Kitchen lisinopril (PRINIVIL,ZESTRIL) 40 MG tablet TAKE 1 TABLET BY MOUTH ONCE DAILY 90 tablet 1  . metoprolol succinate (TOPROL-XL)  25 MG 24 hr tablet TAKE 1 TABLET BY MOUTH ONCE DAILY 90 tablet 2  . Multiple Vitamins-Minerals (MULTIVITAMIN ADULT PO) Take by mouth daily.    Marland Kitchen omeprazole (PRILOSEC) 40 MG capsule TAKE 1 CAPSULE BY MOUTH DAILY BEFORE  BREAKFAST (Patient taking differently: Take 40 mg by mouth as needed. ) 30 capsule 3  . triamterene-hydrochlorothiazide (MAXZIDE-25) 37.5-25 MG tablet Take 1 tablet by mouth daily. 90 tablet 3   No current facility-administered medications for this visit.     Allergies:   Penicillins    Social History:  The patient  reports that she has never smoked. She has never used smokeless tobacco. She reports current alcohol use. She reports that she does not use drugs.   Family History:  The patient's family history includes Alcohol abuse in her brother; Hypertension in her brother, maternal grandfather, maternal grandmother, and mother; Stroke in her brother, brother, and mother.    ROS:  Please see the history of present illness.   Otherwise, review of systems are positive for chest pain.   All other systems are reviewed and negative.    PHYSICAL EXAM: VS:  BP (!) 150/98 (BP Location: Right Arm, Patient Position: Sitting, Cuff Size: Normal)   Pulse  69   Ht 5\' 2"  (1.575 m)   Wt 172 lb (78 kg)   SpO2 99%   BMI 31.46 kg/m  , BMI Body mass index is 31.46 kg/m. GEN: Well nourished, well developed, in no acute distress  HEENT: normal  Neck: no JVD, carotid bruits, or masses Cardiac: RRR; no murmurs, rubs, or gallops,no edema  Respiratory:  clear to auscultation bilaterally, normal work of breathing GI: soft, nontender, nondistended, + BS MS: no deformity or atrophy  Skin: warm and dry, no rash Neuro:  Strength and sensation are intact Psych: euthymic mood, full affect   EKG:   The ekg ordered today demonstrates NSR, no ST changes   Recent Labs: 10/14/2017: ALT 14; BUN 10; Creatinine, Ser 0.98; Potassium 3.9; Sodium 138 01/12/2018: TSH 1.13   Lipid Panel    Component  Value Date/Time   CHOL 169 10/14/2017 0852   TRIG 106.0 10/14/2017 0852   HDL 37.90 (L) 10/14/2017 0852   CHOLHDL 4 10/14/2017 0852   VLDL 21.2 10/14/2017 0852   LDLCALC 110 (H) 10/14/2017 2025     Other studies Reviewed: Additional studies/ records that were reviewed today with results demonstrating: stress test not available; LDL 110 in July 2019.   ASSESSMENT AND PLAN:  1. Chest pain: Plan for CTA coronaries.  Some risk factors for heart disease.  Negative stress test per report several years ago.  Back pain would limit her on the treadmill. 2. HTN: BP increased.   Has been eating a lot of canned soups.  She will decreased this and see if BP decreases.  Prior to the soups, she had normal BP readings.  3. Hyperlipidemia: LDL 110.  COntinue lipitor.  SHould also improve with going back to plant based diet.    Current medicines are reviewed at length with the patient today.  The patient concerns regarding her medicines were addressed.  The following changes have been made:  No change  Labs/ tests ordered today include: Coronary CTA No orders of the defined types were placed in this encounter.   Recommend 150 minutes/week of aerobic exercise Low fat, low carb, high fiber diet recommended  Disposition:   FU in as needed   Signed, Lance Muss, MD  04/17/2018 2:46 PM    [facility] [address] [place], [place], [place]  [address] Phone: [phone]; Fax: [phone]

## 2018-04-17 NOTE — Patient Instructions (Addendum)
Medication Instructions:  Your physician recommends that you continue on your current medications as directed. Please refer to the Current Medication list given to you today.  If you need a refill on your cardiac medications before your next appointment, please call your pharmacy.   Lab work: Your physician recommends that you return for lab work Designer, jewellery) prior to Cardiac CT   If you have labs (blood work) drawn today and your tests are completely normal, you will receive your results only by: Marland Kitchen MyChart Message (if you have MyChart) OR . A paper copy in the mail If you have any lab test that is abnormal or we need to change your treatment, we will call you to review the results.  Testing/Procedures: Your physician has requested that you have cardiac CT. Cardiac computed tomography (CT) is a painless test that uses an x-ray machine to take clear, detailed pictures of your heart. For further information please visit [email or web address] Please follow instruction sheet as given.  Follow-Up: . Based on test results  Any Other Special Instructions Will Be Listed Below (If Applicable).  CARDIAC CT INSTRUCTIONS  Please arrive at the [facility] main entrance of [facility] on ______ at _______ AM (30-45 minutes prior to test start time)  [facility] [address] [place], [place] [address] [phone]  Proceed to the [facility] Radiology Department (First Floor).  Please follow these instructions carefully (unless otherwise directed):   On the Night Before the Test: . Be sure to Drink plenty of water. . Do not consume any caffeinated/decaffeinated beverages or chocolate 12 hours prior to your test. . Do not take any antihistamines 12 hours prior to your test.  On the Day of the Test: . Drink plenty of water. Do not drink any water within one hour of the test. . Do not eat any food 4 hours prior to the test. . You may take your regular medications prior to  the test.  . Take metoprolol succinate (Toprol-XL) 50 mg total (2 tablets) two hours prior to test. . HOLD triamterene-Hydrochlorothiazide (Maxzide) the morning of the test.    After the Test: . Drink plenty of water. . After receiving IV contrast, you may experience a mild flushed feeling. This is normal. . On occasion, you may experience a mild rash up to 24 hours after the test. This is not dangerous. If this occurs, you can take Benadryl 25 mg and increase your fluid intake. . If you experience trouble breathing, this can be serious. If it is severe call 911 IMMEDIATELY. If it is mild, please call our office.

## 2018-04-25 ENCOUNTER — Encounter: Payer: Self-pay | Admitting: Family Medicine

## 2018-04-25 DIAGNOSIS — Z1211 Encounter for screening for malignant neoplasm of colon: Secondary | ICD-10-CM | POA: Diagnosis not present

## 2018-04-25 DIAGNOSIS — D125 Benign neoplasm of sigmoid colon: Secondary | ICD-10-CM | POA: Diagnosis not present

## 2018-04-25 DIAGNOSIS — K5730 Diverticulosis of large intestine without perforation or abscess without bleeding: Secondary | ICD-10-CM | POA: Diagnosis not present

## 2018-04-25 DIAGNOSIS — K635 Polyp of colon: Secondary | ICD-10-CM | POA: Diagnosis not present

## 2018-04-25 DIAGNOSIS — D122 Benign neoplasm of ascending colon: Secondary | ICD-10-CM | POA: Diagnosis not present

## 2018-05-12 ENCOUNTER — Other Ambulatory Visit: Payer: Medicare Other | Admitting: *Deleted

## 2018-05-12 DIAGNOSIS — R072 Precordial pain: Secondary | ICD-10-CM | POA: Diagnosis not present

## 2018-05-12 DIAGNOSIS — Z01812 Encounter for preprocedural laboratory examination: Secondary | ICD-10-CM

## 2018-05-12 LAB — BASIC METABOLIC PANEL
BUN / CREAT RATIO: 10 — AB (ref 12–28)
BUN: 10 mg/dL (ref 8–27)
CO2: 24 mmol/L (ref 20–29)
Calcium: 9.3 mg/dL (ref 8.7–10.3)
Chloride: 94 mmol/L — ABNORMAL LOW (ref 96–106)
Creatinine, Ser: 1.01 mg/dL — ABNORMAL HIGH (ref 0.57–1.00)
GFR calc non Af Amer: 57 mL/min/{1.73_m2} — ABNORMAL LOW (ref >59)
GFR, EST AFRICAN AMERICAN: 66 mL/min/{1.73_m2} (ref >59)
GLUCOSE: 95 mg/dL (ref 65–99)
POTASSIUM: 3.3 mmol/L — AB (ref 3.5–5.2)
SODIUM: 136 mmol/L (ref 134–144)

## 2018-05-15 ENCOUNTER — Telehealth (HOSPITAL_COMMUNITY): Payer: Self-pay | Admitting: Emergency Medicine

## 2018-05-15 NOTE — Telephone Encounter (Signed)
Reaching out to patient to offer assistance regarding upcoming cardiac imaging study; pt verbalizes understanding of appt date/time, parking situation and where to check in, pre-test NPO status and medications ordered, and verified current allergies; name and call back number provided for further questions should they arise Rockwell Alexandria RN Navigator Cardiac Imaging [facility] [phone] office [phone] cell   Pt instructed to take 2 tablets of metoprolol XL 2 hr prior to scan

## 2018-05-15 NOTE — Telephone Encounter (Signed)
Left message on voicemail with name and callback number Mohamud Mrozek RN Navigator Cardiac Imaging [facility] [phone] Office [phone] Cell  

## 2018-05-17 ENCOUNTER — Encounter (HOSPITAL_COMMUNITY): Payer: Self-pay

## 2018-05-17 ENCOUNTER — Ambulatory Visit (HOSPITAL_COMMUNITY)
Admission: RE | Admit: 2018-05-17 | Discharge: 2018-05-17 | Disposition: A | Discharge status: Home / Self Care | Payer: Medicare Other | Source: Ambulatory Visit | Attending: Interventional Cardiology | Admitting: Interventional Cardiology

## 2018-05-17 ENCOUNTER — Ambulatory Visit (HOSPITAL_COMMUNITY): Admission: RE | Admit: 2018-05-17 | Payer: Medicare Other | Source: Ambulatory Visit

## 2018-05-17 DIAGNOSIS — R072 Precordial pain: Secondary | ICD-10-CM | POA: Diagnosis not present

## 2018-05-17 MED ORDER — NITROGLYCERIN 0.4 MG SL SUBL
0.8000 mg | SUBLINGUAL_TABLET | Freq: Once | SUBLINGUAL | Status: AC
  Administered 2018-05-17: 0.8 mg via SUBLINGUAL
  Filled 2018-05-17: qty 25

## 2018-05-17 MED ORDER — NITROGLYCERIN 0.4 MG SL SUBL
SUBLINGUAL_TABLET | SUBLINGUAL | Status: AC
  Administered 2018-05-17: 0.8 mg via SUBLINGUAL
  Filled 2018-05-17: qty 2

## 2018-05-17 MED ORDER — IOPAMIDOL (ISOVUE-370) INJECTION 76%
80.0000 mL | Freq: Once | INTRAVENOUS | Status: AC | PRN
  Administered 2018-05-17: 80 mL via INTRAVENOUS

## 2018-05-17 NOTE — Progress Notes (Signed)
Patent tolerated CT well and after states stomach was upset probably due to not eating since yesterday. Gave patient water, peanut butter crackers, and vanilla cookies to eat.

## 2018-05-17 NOTE — Progress Notes (Signed)
Patient states after eating stomach feels better and not upset. States has a slight headache 5/10 achy. States does not drink coffee or coke and will take tylenol when patient gets home. Alert answering and following commands appropriate. Ambulatory steady gait to exit.

## 2018-06-10 ENCOUNTER — Other Ambulatory Visit: Payer: Self-pay | Admitting: Family Medicine

## 2018-06-10 DIAGNOSIS — E785 Hyperlipidemia, unspecified: Secondary | ICD-10-CM

## 2018-09-01 DIAGNOSIS — Z20828 Contact with and (suspected) exposure to other viral communicable diseases: Secondary | ICD-10-CM | POA: Diagnosis not present

## 2018-10-17 ENCOUNTER — Encounter: Payer: Self-pay | Admitting: Family Medicine

## 2018-10-17 ENCOUNTER — Other Ambulatory Visit: Payer: Self-pay

## 2018-10-17 ENCOUNTER — Telehealth: Payer: Self-pay | Admitting: *Deleted

## 2018-10-17 ENCOUNTER — Ambulatory Visit (INDEPENDENT_AMBULATORY_CARE_PROVIDER_SITE_OTHER): Payer: Medicare Other | Admitting: Family Medicine

## 2018-10-17 VITALS — BP 119/86 | HR 74 | Resp 12

## 2018-10-17 DIAGNOSIS — I10 Essential (primary) hypertension: Secondary | ICD-10-CM

## 2018-10-17 DIAGNOSIS — E876 Hypokalemia: Secondary | ICD-10-CM | POA: Diagnosis not present

## 2018-10-17 DIAGNOSIS — Z0000 Encounter for general adult medical examination without abnormal findings: Secondary | ICD-10-CM

## 2018-10-17 DIAGNOSIS — E782 Mixed hyperlipidemia: Secondary | ICD-10-CM

## 2018-10-17 DIAGNOSIS — L678 Other hair color and hair shaft abnormalities: Secondary | ICD-10-CM

## 2018-10-17 DIAGNOSIS — R7303 Prediabetes: Secondary | ICD-10-CM | POA: Diagnosis not present

## 2018-10-17 DIAGNOSIS — K219 Gastro-esophageal reflux disease without esophagitis: Secondary | ICD-10-CM

## 2018-10-17 MED ORDER — OMEPRAZOLE 40 MG PO CPDR: 40.0000 mg | DELAYED_RELEASE_CAPSULE | ORAL | 2 refills | Status: DC | PRN

## 2018-10-17 MED ORDER — EFLORNITHINE HCL 13.9 % EX CREA: TOPICAL_CREAM | CUTANEOUS | 1 refills | Status: DC

## 2018-10-17 NOTE — Assessment & Plan Note (Addendum)
We discussed possible etiologies. Most likely related to monopause. Explained that this is considered a esthetic problem, so insurance coverage is limited or absent. She would like to try topical treatment again, recommend Eflornithine 13.9% bid.

## 2018-10-17 NOTE — Telephone Encounter (Signed)
Patient called the office to schedule a lab appt today.  During COVID pre-visit screening, patient stated she has had a sore throat.  Lab appt declined due to this and I informed the pt of this.  She stated she is going out of town and it will be a while before she calls back for an appt.  Dr Martinique was informed of this as I questioned when the pt would be able to come back in?  Dr Martinique stated if the pt is having a sore throat (she did not mention it during visit) she should not be leaving town until she feels better;mostly when she is concerned about COVID 19. Monitor for fever,cough,changes in smell or taste.  I left a message for the pt to return my call and a CRM was created.

## 2018-10-17 NOTE — Assessment & Plan Note (Signed)
Recommend a healthy lifestyle for primary prevention of DM 2. Further recommendation will be given according to A1c results.

## 2018-10-17 NOTE — Progress Notes (Addendum)
Virtual Visit via Video Note   I connected with [name] on 10/17/18 at  7:00 AM EDT by a video enabled telemedicine application and verified that I am speaking with the correct person using two identifiers.  Location patient: home Location provider:work or home office Persons participating in the virtual visit: patient, provider  I discussed the limitations of evaluation and management by telemedicine and the availability of in person appointments. The patient expressed understanding and agreed to proceed.  Chief Complaint  Patient presents with  . Medicare Wellness  . Follow-up     HPI: [name] is a 68 yo female with hx of HTN,HLD,and obesity who is following on chronic medical problems and for AWV.  She lives with her female friend. Independent ADL's and IADL's. No falls in the past year and denies depression symptoms. She is planning on leaving town tomorrow to stay with her son, states that he is having some problems and needs her there with him.  Functional Status Survey: Is the patient deaf or have difficulty hearing?: No Does the patient have difficulty seeing, even when wearing glasses/contacts?: No Does the patient have difficulty concentrating, remembering, or making decisions?: No Does the patient have difficulty walking or climbing stairs?: No Does the patient have difficulty dressing or bathing?: No Does the patient have difficulty doing errands alone such as visiting a doctor's office or shopping?: No  Fall Risk  10/17/2018 10/12/2017  Falls in the past year? 0 No  Number falls in past yr: 0 -  Injury with Fall? 0 -  Follow up Education provided -    Providers she sees regularly: [facility] last visit 12/2016 Dr. Everardo AllEllison, follows annually for multinodular goiter. Cardiologist as needed, last visit 04/17/2018 with Dr.Varanasi.   [facility] 10/17/2018  Decreased Interest 0  Down, Depressed, Hopeless 0  PHQ - 2 Score  0    Mini-Cog - 10/17/18 0822    Normal clock drawing test?  yes    How many words correct?  3        Hearing Screening   125Hz  250Hz  500Hz  1000Hz  2000Hz  3000Hz  4000Hz  6000Hz  8000Hz   Right ear:           Left ear:           Vision Screening Comments: Virtual visit.  Prediabetes: Denies polydipsia,polyuria, or polyphagia.  Lab Results  Component Value Date   HGBA1C 6.3 08/23/2016   Hypertension: She is concerned about some "low" BPs and intermittent episodes of feeling tired/fatigue, which she attributed to BP readings. Usually SBP is in the low 100's. BP 119/86,116/90.  Denies severe/frequent headache, visual changes, chest pain, dyspnea, palpitation, claudication, focal weakness, or edema. Currently she is on lisinopril 40 mg daily, Maxide 37.5-25 mg daily, and metoprolol succinate 25 mg daily.   Lab Results  Component Value Date   CREATININE 1.01 (H) 05/12/2018   BUN 10 05/12/2018   NA 136 05/12/2018   K 3.3 (L) 05/12/2018   CL 94 (L) 05/12/2018   CO2 24 05/12/2018   Hyperlipidemia She is on atorvastatin 40 mg daily. Frequently she forgets to take medication. She has not been consistent with following a low-fat diet. In general she is tolerating medication well.  Lab Results  Component Value Date   CHOL 169 10/14/2017   HDL 37.90 (L) 10/14/2017   LDLCALC 110 (H) 10/14/2017   TRIG 106.0 10/14/2017   CHOLHDL 4 10/14/2017   She is also complaining about facial hair,  she has had this problem for several years. She has tried laser treatment, she did not feel like it was helping and it was expensive.  GERD, currently she is on omeprazole 40 mg daily. Acid reflux aggravated by food intake. She denies abdominal pain, nausea, vomiting, changes in bowel habits, melena, or bloody stool.  She has not been consistent with a healthful diet but has been exercising a few times per week. Walking daily from 6:30 AM to 7:15 AM. She has noticed some weight gain.  ROS:  See pertinent positives and negatives per HPI.  Past Medical History:  Diagnosis Date  . Allergy   . Cataract    "beginnings of"  . GERD (gastroesophageal reflux disease)   . Hyperlipidemia   . Hypertension   . Thyroid disease    2 benign nodules noted- check yearly  . Vitamin D deficiency     Past Surgical History:  Procedure Laterality Date  . ABDOMINAL HYSTERECTOMY    . SIGMOIDOSCOPY    . WISDOM TOOTH EXTRACTION      Family History  Problem Relation Age of Onset  . Stroke Mother   . Hypertension Mother   . Stroke Brother   . Hypertension Maternal Grandmother   . Hypertension Maternal Grandfather   . Alcohol abuse Brother   . Hypertension Brother   . Stroke Brother   . Thyroid disease Neg Hx   . Colon cancer Neg Hx   . Esophageal cancer Neg Hx   . Rectal cancer Neg Hx   . Stomach cancer Neg Hx     Social History   Socioeconomic History  . Marital status: Divorced    Spouse name: Not on file  . Number of children: Not on file  . Years of education: Not on file  . Highest education level: Not on file  Occupational History  . Not on file  Social Needs  . Financial resource strain: Not on file  . Food insecurity    Worry: Not on file    Inability: Not on file  . Transportation needs    Medical: Not on file    Non-medical: Not on file  Tobacco Use  . Smoking status: Never Smoker  . Smokeless tobacco: Never Used  Substance and Sexual Activity  . Alcohol use: Yes    Comment: Occ  . Drug use: No  . Sexual activity: Not on file  Lifestyle  . Physical activity    Days per week: Not on file    Minutes per session: Not on file  . Stress: Not on file  Relationships  . Social Musicianconnections    Talks on phone: Not on file    Gets together: Not on file    Attends religious service: Not on file    Active member of club or organization: Not on file    Attends meetings of clubs or organizations: Not on file    Relationship status: Not on file  . Intimate  partner violence    Fear of current or ex partner: Not on file    Emotionally abused: Not on file    Physically abused: Not on file    Forced sexual activity: Not on file  Other Topics Concern  . Not on file  Social History Narrative  . Not on file      Current Outpatient Medications:  .  atorvastatin (LIPITOR) 40 MG tablet, Take 1 tablet by mouth once daily, Disp: 90 tablet, Rfl: 0 .  Cholecalciferol (VITAMIN D3) 50000  units TABS, Take by mouth once a week., Disp: , Rfl:  .  Eflornithine HCl 13.9 % cream, Apply on affected area twice daily, at least 8 hours apart., Disp: 45 g, Rfl: 1 .  lisinopril (PRINIVIL,ZESTRIL) 40 MG tablet, TAKE 1 TABLET BY MOUTH ONCE DAILY, Disp: 90 tablet, Rfl: 1 .  metoprolol succinate (TOPROL-XL) 25 MG 24 hr tablet, TAKE 1 TABLET BY MOUTH ONCE DAILY, Disp: 90 tablet, Rfl: 2 .  Multiple Vitamins-Minerals (MULTIVITAMIN ADULT PO), Take by mouth daily., Disp: , Rfl:  .  omeprazole (PRILOSEC) 40 MG capsule, Take 1 capsule (40 mg total) by mouth as needed., Disp: 90 capsule, Rfl: 2 .  triamterene-hydrochlorothiazide (MAXZIDE-25) 37.5-25 MG tablet, Take 1 tablet by mouth daily., Disp: 90 tablet, Rfl: 3  EXAM:  VITALS per patient if applicable:BP 409/81   Pulse 74   Resp 12   GENERAL: alert, oriented, appears well and in no acute distress  HEENT: atraumatic, conjunctiva clear, no obvious facial abnormalities on inspection.  NECK: normal movements of the head and neck  LUNGS: on inspection no signs of respiratory distress, breathing rate appears normal, no obvious gross SOB, gasping or wheezing  CV: no obvious cyanosis  MS: moves all visible extremities without noticeable abnormality  PSYCH/NEURO: pleasant and cooperative, no obvious depression or anxiety, speech and thought processing grossly intact  ASSESSMENT AND PLAN:  Discussed the following assessment and plan:  Orders Placed This Encounter  Procedures  . Comprehensive metabolic panel  .  Hemoglobin A1c  . Lipid panel    Medicare annual wellness visit, subsequent * We discussed the importance of staying active, physically and mentally, as well as the benefits of a healthy/balance diet. Low impact exercise that involve stretching and strengthing are ideal. Vaccines are not up-to-date, recommend getting Tdap and Shingrix at her pharmacy.  We discussed preventive screening for the next 5-10 years..  Colonoscopy is due in 2015. Mammogram is due in 03/2019 DEXA was last done in 02/2018, normal.  We will plan on repeating DEXA in 2022. She has power of attorney and living will, thinking about making some changes.  Ask for a copy if possible to bring to her next office visit. Continue daily calcium and vitamin D supplementation. Fall prevention.     Health Maintenance  Topic Date Due  . Tetanus Vaccine  10/04/2019*  . Flu Shot  11/04/2018  . Mammogram  02/24/2020  . Colon Cancer Screening  04/25/2028  . DEXA scan (bone density measurement)  Completed  .  Hepatitis C: One time screening is recommended by Center for Disease Control  (CDC) for  adults born from [birth year] through 1967.   Completed  . Pneumonia vaccines  Completed  *Topic was postponed. The date shown is not the original due date.    Hypokalemia - Plan: Side effects of diuretics discussed. Potassium rich diet recommended for now. Further recommendation will be given according to BMP results.  Abnormal facial hair We discussed possible etiologies. Most likely related to monopause. Explained that this is considered a esthetic problem, so insurance coverage is limited or absent. She would like to try topical treatment again, recommend Eflornithine 13.9% bid.  Hypertension, essential, benign Goal BP < 140/90. Recommend decreasing Maxide 37.5-25 mg from 1 tablet to 1/2 tablet. Continue lisinopril 40 mg daily and metoprolol succinate 25 mg daily. Continue monitoring BP daily. Low-salt diet also  recommended. Follow-up in 4 months, before if needed.  GERD (gastroesophageal reflux disease) Problem is well controlled with daily PPI. Continue  omeprazole 40 mg daily. GERD precautions also recommended.  Hyperlipidemia Educated about the importance of compliance with medication, she can take atorvastatin 40 mg in the morning if this is easier for her. Low-fat diet also recommended. Further recommendation will be given according to lipid panel results.  Prediabetes Recommend a healthy lifestyle for primary prevention of DM 2. Further recommendation will be given according to A1c results.    I discussed the assessment and treatment plan with the patient. She was provided an opportunity to ask questions and all were answered. She agreed with the plan and demonstrated an understanding of the instructions.    Return in about 4 months (around 02/17/2019) for HTN.    Tatsuo Musial [place], MD

## 2018-10-17 NOTE — Assessment & Plan Note (Signed)
Goal BP < 140/90. Recommend decreasing Maxide 37.5-25 mg from 1 tablet to 1/2 tablet. Continue lisinopril 40 mg daily and metoprolol succinate 25 mg daily. Continue monitoring BP daily. Low-salt diet also recommended. Follow-up in 4 months, before if needed.

## 2018-10-17 NOTE — Assessment & Plan Note (Signed)
Educated about the importance of compliance with medication, she can take atorvastatin 40 mg in the morning if this is easier for her. Low-fat diet also recommended. Further recommendation will be given according to lipid panel results.

## 2018-10-17 NOTE — Assessment & Plan Note (Signed)
Problem is well controlled with daily PPI. Continue omeprazole 40 mg daily. GERD precautions also recommended.

## 2018-10-18 ENCOUNTER — Ambulatory Visit: Payer: Medicare Other

## 2018-11-17 ENCOUNTER — Telehealth: Payer: Self-pay | Admitting: *Deleted

## 2018-11-17 NOTE — Telephone Encounter (Signed)
Pt returned vm from [place]. Advised to send phone note. Please return call.

## 2018-11-17 NOTE — Telephone Encounter (Signed)
Left message for patient to return call to office concerning b/p medication.

## 2018-11-22 NOTE — Telephone Encounter (Signed)
Tried calling patient. LVM to call office to schedule in office or virtual visit with Dr. [place] to discuss blood pressure and treatment.

## 2018-11-27 ENCOUNTER — Other Ambulatory Visit: Payer: Self-pay

## 2018-11-27 ENCOUNTER — Encounter: Payer: Self-pay | Admitting: Family Medicine

## 2018-11-27 ENCOUNTER — Telehealth (INDEPENDENT_AMBULATORY_CARE_PROVIDER_SITE_OTHER): Payer: Medicare Other | Admitting: Family Medicine

## 2018-11-27 VITALS — BP 127/81 | HR 81 | Resp 12 | Ht 62.0 in

## 2018-11-27 DIAGNOSIS — R198 Other specified symptoms and signs involving the digestive system and abdomen: Secondary | ICD-10-CM

## 2018-11-27 DIAGNOSIS — R51 Headache: Secondary | ICD-10-CM

## 2018-11-27 DIAGNOSIS — R1030 Lower abdominal pain, unspecified: Secondary | ICD-10-CM

## 2018-11-27 DIAGNOSIS — I10 Essential (primary) hypertension: Secondary | ICD-10-CM

## 2018-11-27 DIAGNOSIS — R519 Headache, unspecified: Secondary | ICD-10-CM

## 2018-11-27 DIAGNOSIS — R200 Anesthesia of skin: Secondary | ICD-10-CM

## 2018-11-27 NOTE — Telephone Encounter (Signed)
Patient has virtual visit for 11/27/2018 to discuss b/p and treatment with PCP. Nothing further needed at this time.

## 2018-11-27 NOTE — Progress Notes (Signed)
Virtual Visit via Video Note   I connected with on 11/27/18 by a video enabled telemedicine application and verified that I am speaking with the correct person using two identifiers.  Location patient: home Location provider:work or home office Persons participating in the virtual visit: patient, provider  I discussed the limitations of evaluation and management by telemedicine and the availability of in person appointments. The patient expressed understanding and agreed to proceed.   HPI: [name] is a 68 year old female who was last seen on 10/17/2018. This appointment was arranged because I received a note from [facility] [employer] expressing a concern about noncompliance with medications due to possible side effects, vomiting.  [name] denies any problem or side effects when she takes medications. She is telling me that she is taking medication as instructed. Labs ordered last visit,she did not have them done.  Last visit, I recommended decreasing dose of Maxide from 1tablet to 1/2 tablet, but she continue taking the whole tablet. She is also on lisinopril 40 mg and metoprolol succinate 25 mg daily.  She has been checking BP, he has been in the 120s-130s/80s,she got 148/98 x 1.  She mentions 3 to 4 days of left occipital and sometimes temporal frontal pressure headache, mild. Not associated with visual changes, nausea, or vomiting. Left upper back pain, no limitation of shoulder. Negative for trauma. Trapezial and cervical muscles are "tight." Pain is not radiated. 2 to 3 days ago she started with intermittently numbness of a few fingers left hand.  She has not noted weakness, she has not identified exacerbating or alleviating factors.  She is planning on having a massage.  She is complaining about 2 weeks of lower abdominal cramps, "annoying" sensation, intermittently, and no radiated. Initially she was having some constipation then she had 2 to 3 days of diarrhea and now back to  almost "normal stool."  She thinks symptoms may be related to something she ate. Rectal sensation of rectal fullness,urge to have a bowel movement. Lower abdominal cramps are exacerbated by food intake and alleviated by defecation or by passing gas.   Similar symptoms reported on 03/08/2018.  She has not seen blood in the stool.  Mild dyschezia. Colonoscopy in 04/2018, diverticulosis.  She denies sick contact. Negative for fever, chills, unusual fatigue, body aches, nausea, vomiting,or urinary symptoms.  ROS: See pertinent positives and negatives per HPI.  Past Medical History:  Diagnosis Date  . Allergy   . Cataract    "beginnings of"  . GERD (gastroesophageal reflux disease)   . Hyperlipidemia   . Hypertension   . Thyroid disease    2 benign nodules noted- check yearly  . Vitamin D deficiency     Past Surgical History:  Procedure Laterality Date  . ABDOMINAL HYSTERECTOMY    . SIGMOIDOSCOPY    . WISDOM TOOTH EXTRACTION      Family History  Problem Relation Age of Onset  . Stroke Mother   . Hypertension Mother   . Stroke Brother   . Hypertension Maternal Grandmother   . Hypertension Maternal Grandfather   . Alcohol abuse Brother   . Hypertension Brother   . Stroke Brother   . Thyroid disease Neg Hx   . Colon cancer Neg Hx   . Esophageal cancer Neg Hx   . Rectal cancer Neg Hx   . Stomach cancer Neg Hx     Social History   Socioeconomic History  . Marital status: Divorced    Spouse name: Not on file  .  Number of children: Not on file  . Years of education: Not on file  . Highest education level: Not on file  Occupational History  . Not on file  Social Needs  . Financial resource strain: Not on file  . Food insecurity    Worry: Not on file    Inability: Not on file  . Transportation needs    Medical: Not on file    Non-medical: Not on file  Tobacco Use  . Smoking status: Never Smoker  . Smokeless tobacco: Never Used  Substance and Sexual Activity   . Alcohol use: Yes    Comment: Occ  . Drug use: No  . Sexual activity: Not on file  Lifestyle  . Physical activity    Days per week: Not on file    Minutes per session: Not on file  . Stress: Not on file  Relationships  . Social Musicianconnections    Talks on phone: Not on file    Gets together: Not on file    Attends religious service: Not on file    Active member of club or organization: Not on file    Attends meetings of clubs or organizations: Not on file    Relationship status: Not on file  . Intimate partner violence    Fear of current or ex partner: Not on file    Emotionally abused: Not on file    Physically abused: Not on file    Forced sexual activity: Not on file  Other Topics Concern  . Not on file  Social History Narrative  . Not on file     Current Outpatient Medications:  .  atorvastatin (LIPITOR) 40 MG tablet, Take 1 tablet by mouth once daily, Disp: 90 tablet, Rfl: 0 .  Cholecalciferol (VITAMIN D3) 50000 units TABS, Take by mouth once a week., Disp: , Rfl:  .  Eflornithine HCl 13.9 % cream, Apply on affected area twice daily, at least 8 hours apart., Disp: 45 g, Rfl: 1 .  lisinopril (PRINIVIL,ZESTRIL) 40 MG tablet, TAKE 1 TABLET BY MOUTH ONCE DAILY, Disp: 90 tablet, Rfl: 1 .  metoprolol succinate (TOPROL-XL) 25 MG 24 hr tablet, TAKE 1 TABLET BY MOUTH ONCE DAILY, Disp: 90 tablet, Rfl: 2 .  Multiple Vitamins-Minerals (MULTIVITAMIN ADULT PO), Take by mouth daily., Disp: , Rfl:  .  omeprazole (PRILOSEC) 40 MG capsule, Take 1 capsule (40 mg total) by mouth as needed., Disp: 90 capsule, Rfl: 2 .  triamterene-hydrochlorothiazide (MAXZIDE-25) 37.5-25 MG tablet, Take 1 tablet by mouth daily., Disp: 90 tablet, Rfl: 3  EXAM:  VITALS per patient if applicable:BP 127/81   Pulse 81   Resp 12 Comment: approx  Ht 5\' 2"  (1.575 m)   BMI 31.46 kg/m   GENERAL: alert, oriented, appears well and in no acute distress  HEENT: atraumatic, conjunctiva clear, no obvious  abnormalities on inspection.  NECK: normal movements of the head and neck. Pain elicit left trapezium. No limitation of left shoulder ROM.  LUNGS: on inspection no signs of respiratory distress, breathing rate appears normal, no obvious gross SOB, gasping or wheezing  CV: no obvious cyanosis  MS: moves all visible extremities without noticeable abnormality  PSYCH/NEURO: pleasant and cooperative, no obvious depression or anxiety, speech and thought processing grossly intact  ASSESSMENT AND PLAN:  Discussed the following assessment and plan:  Lower abdominal pain She is not having abdominal pain at this time. History does not suggest a serious process. Most likely related to constipation. Recommend adequate fiber and  fluid intake. Clearly instructed about warning signs.  Headache, unspecified headache type Associated with upper back pain. ?  Tension headache. She is planning on having a massage today, it may help. She can take Tylenol 500 mg 3 times daily as needed. Follow-up as needed.  Rectal tenesmus We discussed possible etiologies. Rectal examination is necessary at this time. Colonoscopy done in 04/2018 was otherwise negative,exept for polyp and diverticolosis. ?  Hemorrhoids. She wants to wait until next follow-up appointment.  Finger numbness No focal deficit. ?  Carpal tunnel syndrome.   Cervical radiculopathy also to be considerred Recommend wearing a wrist splint at night. Monitor for worsening symptoms.  Hypertension, essential, benign For now continue same antihypertensive medications. Continue monitoring BP periodically. Low-salt diet recommended. Instructed about warning signs.   I discussed the assessment and treatment plan with the patient.  She was provided an opportunity to ask questions and all were answered.  She agreed with the plan and demonstrated an understanding of the instructions.   The patient was advised to call back or seek an in-person  evaluation if the symptoms worsen or if the condition fails to improve as anticipated.   Follow-up as needed, keep next follow-up appointment.   Janeliz Prestwood [place], MD

## 2018-11-28 ENCOUNTER — Telehealth: Payer: Self-pay | Admitting: Family Medicine

## 2018-11-28 NOTE — Telephone Encounter (Signed)
LMVM for the patient to contact the office to make a follow up appointment at the end of November beginning of December

## 2019-01-02 ENCOUNTER — Other Ambulatory Visit: Payer: Self-pay | Admitting: Family Medicine

## 2019-01-02 DIAGNOSIS — I10 Essential (primary) hypertension: Secondary | ICD-10-CM

## 2019-01-02 MED ORDER — METOPROLOL SUCCINATE ER 25 MG PO TB24: 25.0000 mg | ORAL_TABLET | Freq: Every day | ORAL | 0 refills | Status: DC

## 2019-01-02 NOTE — Telephone Encounter (Signed)
Medication Refill - Medication: metoprolol succinate (TOPROL-XL) 25 MG 24 hr tablet Pt is out of medication and has been without it for over a week.  Has the patient contacted their pharmacy? No. (Agent: If no, request that the patient contact the pharmacy for the refill.) (Agent: If yes, when and what did the pharmacy advise?)  Preferred Pharmacy (with phone number or street name): [place], [place] - [phone] (Phone) [phone] (Fax)     Agent: Please be advised that RX refills may take up to 3 business days. We ask that you follow-up with your pharmacy.

## 2019-01-02 NOTE — Telephone Encounter (Signed)
Requested Prescriptions  Pending Prescriptions Disp Refills  . metoprolol succinate (TOPROL-XL) 25 MG 24 hr tablet 90 tablet 0    Sig: Take 1 tablet (25 mg total) by mouth daily.     Cardiovascular:  Beta Blockers Passed - 01/02/2019  3:52 PM      Passed - Last BP in normal range    BP Readings from Last 1 Encounters:  11/27/18 127/81         Passed - Last Heart Rate in normal range    Pulse Readings from Last 1 Encounters:  11/27/18 81         Passed - Valid encounter within last 6 months    Recent Outpatient Visits          1 month ago Lower abdominal pain   [facility] [place], Malka So, MD   2 months ago Medicare annual wellness visit, subsequent   [facility] [place], Malka So, MD   10 months ago Lower abdominal pain   [facility] [place], Malka So, MD   1 year ago Hypertension, essential, benign   [facility] [place], Malka So, MD   1 year ago Abscess of labia majora   [facility], Malka So, MD      Future Appointments            In 2 months [place], Malka So, MD [employer] [facility] [place]           next appt. 03/07/19

## 2019-01-10 ENCOUNTER — Other Ambulatory Visit: Payer: Self-pay | Admitting: Family Medicine

## 2019-01-10 DIAGNOSIS — Z1231 Encounter for screening mammogram for malignant neoplasm of breast: Secondary | ICD-10-CM

## 2019-01-16 ENCOUNTER — Ambulatory Visit: Payer: Medicare Other | Admitting: Endocrinology

## 2019-01-23 ENCOUNTER — Ambulatory Visit: Payer: Medicare Other | Admitting: Endocrinology

## 2019-01-24 ENCOUNTER — Other Ambulatory Visit: Payer: Self-pay

## 2019-01-25 ENCOUNTER — Other Ambulatory Visit: Payer: Self-pay | Admitting: Family Medicine

## 2019-01-25 DIAGNOSIS — I10 Essential (primary) hypertension: Secondary | ICD-10-CM

## 2019-01-26 ENCOUNTER — Ambulatory Visit: Payer: Medicare Other | Admitting: Endocrinology

## 2019-01-26 ENCOUNTER — Other Ambulatory Visit: Payer: Self-pay

## 2019-01-26 ENCOUNTER — Encounter: Payer: Self-pay | Admitting: Endocrinology

## 2019-01-26 VITALS — BP 142/80 | HR 88 | Ht 62.0 in | Wt 164.8 lb

## 2019-01-26 DIAGNOSIS — E042 Nontoxic multinodular goiter: Secondary | ICD-10-CM | POA: Diagnosis not present

## 2019-01-26 LAB — T4, FREE: Free T4: 0.7 ng/dL (ref 0.60–1.60)

## 2019-01-26 LAB — TSH: TSH: 1.08 u[IU]/mL (ref 0.35–4.50)

## 2019-01-26 NOTE — Progress Notes (Signed)
Subjective:    Patient ID: Latasha Young, female    DOB: 1950-04-09, 68 y.o.   MRN: [id number]  HPI Pt returns for f/u of multinodular thyroid (dx'ed 2018; bx then was: R: cat 2, and L: cat 1; she is euthyroid off rx; f/u US is 2019 was unchanged).  pt states she feels well in general.  Specifically, she denies neck swelling.   Past Medical History:  Diagnosis Date  . Allergy   . Cataract    "beginnings of"  . GERD (gastroesophageal reflux disease)   . Hyperlipidemia   . Hypertension   . Thyroid disease    2 benign nodules noted- check yearly  . Vitamin D deficiency     Past Surgical History:  Procedure Laterality Date  . ABDOMINAL HYSTERECTOMY    . SIGMOIDOSCOPY    . WISDOM TOOTH EXTRACTION      Social History   Socioeconomic History  . Marital status: Divorced    Spouse name: Not on file  . Number of children: Not on file  . Years of education: Not on file  . Highest education level: Not on file  Occupational History  . Not on file  Social Needs  . Financial resource strain: Not on file  . Food insecurity    Worry: Not on file    Inability: Not on file  . Transportation needs    Medical: Not on file    Non-medical: Not on file  Tobacco Use  . Smoking status: Never Smoker  . Smokeless tobacco: Never Used  Substance and Sexual Activity  . Alcohol use: Yes    Comment: Occ  . Drug use: No  . Sexual activity: Not on file  Lifestyle  . Physical activity    Days per week: Not on file    Minutes per session: Not on file  . Stress: Not on file  Relationships  . Social Musician on phone: Not on file    Gets together: Not on file    Attends religious service: Not on file    Active member of club or organization: Not on file    Attends meetings of clubs or organizations: Not on file    Relationship status: Not on file  . Intimate partner violence    Fear of current or ex partner: Not on file    Emotionally abused: Not on file    Physically  abused: Not on file    Forced sexual activity: Not on file  Other Topics Concern  . Not on file  Social History Narrative  . Not on file    Current Outpatient Medications on File Prior to Visit  Medication Sig Dispense Refill  . atorvastatin (LIPITOR) 40 MG tablet Take 1 tablet by mouth once daily 90 tablet 0  . Cholecalciferol (VITAMIN D3) 50000 units TABS Take by mouth once a week.    Marland Kitchen lisinopril (PRINIVIL,ZESTRIL) 40 MG tablet TAKE 1 TABLET BY MOUTH ONCE DAILY 90 tablet 1  . metoprolol succinate (TOPROL-XL) 25 MG 24 hr tablet Take 1 tablet (25 mg total) by mouth daily. 90 tablet 0  . Multiple Vitamins-Minerals (MULTIVITAMIN ADULT PO) Take by mouth daily.    Marland Kitchen omeprazole (PRILOSEC) 40 MG capsule Take 1 capsule (40 mg total) by mouth as needed. 90 capsule 2  . triamterene-hydrochlorothiazide (MAXZIDE-25) 37.5-25 MG tablet Take 1 tablet by mouth once daily 90 tablet 0   No current facility-administered medications on file prior to visit.  Allergies  Allergen Reactions  . Penicillins Hives    Family History  Problem Relation Age of Onset  . Stroke Mother   . Hypertension Mother   . Stroke Brother   . Hypertension Maternal Grandmother   . Hypertension Maternal Grandfather   . Alcohol abuse Brother   . Hypertension Brother   . Stroke Brother   . Thyroid disease Neg Hx   . Colon cancer Neg Hx   . Esophageal cancer Neg Hx   . Rectal cancer Neg Hx   . Stomach cancer Neg Hx     BP (!) 142/80 (BP Location: Left Arm, Patient Position: Sitting, Cuff Size: Normal)   Pulse 88   Ht 5\' 2"  (1.575 m)   Wt 164 lb 12.8 oz (74.8 kg)   SpO2 98%   BMI 30.14 kg/m    Review of Systems Denies neck pain.     Objective:   Physical Exam VITAL SIGNS:  See vs page.   GENERAL: no distress.   NECK: 3 cm right thyroid nodule is noted.    Lab Results  Component Value Date   TSH 1.08 01/26/2019      Assessment & Plan:  Thyroid mass: due for recheck HTN: is noted  today   Patient Instructions  Your blood pressure is high today.  Please see your primary care provider soon, to have it rechecked blood tests are requested for you today.  We'll let you know about the results. Let's recheck the ultrasound.  you will receive a phone call, about a day and time for an appointment.   If these are unchanged, please come back for a follow-up appointment in 1 year.

## 2019-01-26 NOTE — Patient Instructions (Addendum)
Your blood pressure is high today.  Please see your primary care provider soon, to have it rechecked blood tests are requested for you today.  We'll let you know about the results. Let's recheck the ultrasound.  you will receive a phone call, about a day and time for an appointment.   If these are unchanged, please come back for a follow-up appointment in 1 year.

## 2019-01-29 ENCOUNTER — Ambulatory Visit
Admission: RE | Admit: 2019-01-29 | Discharge: 2019-01-29 | Disposition: A | Discharge status: Home / Self Care | Payer: Medicare Other | Source: Ambulatory Visit | Attending: Endocrinology | Admitting: Endocrinology

## 2019-01-29 DIAGNOSIS — E041 Nontoxic single thyroid nodule: Secondary | ICD-10-CM | POA: Diagnosis not present

## 2019-01-29 DIAGNOSIS — E042 Nontoxic multinodular goiter: Secondary | ICD-10-CM

## 2019-01-31 ENCOUNTER — Other Ambulatory Visit: Payer: Self-pay

## 2019-01-31 ENCOUNTER — Ambulatory Visit (INDEPENDENT_AMBULATORY_CARE_PROVIDER_SITE_OTHER): Payer: Medicare Other

## 2019-01-31 DIAGNOSIS — Z23 Encounter for immunization: Secondary | ICD-10-CM | POA: Diagnosis not present

## 2019-02-27 ENCOUNTER — Ambulatory Visit: Payer: Medicare Other

## 2019-03-07 ENCOUNTER — Ambulatory Visit: Payer: Medicare Other | Admitting: Family Medicine

## 2019-03-12 DIAGNOSIS — Z20828 Contact with and (suspected) exposure to other viral communicable diseases: Secondary | ICD-10-CM | POA: Diagnosis not present

## 2019-03-26 ENCOUNTER — Ambulatory Visit: Payer: Medicare Other | Admitting: Family Medicine

## 2019-03-26 ENCOUNTER — Other Ambulatory Visit: Payer: Self-pay

## 2019-03-27 ENCOUNTER — Telehealth (INDEPENDENT_AMBULATORY_CARE_PROVIDER_SITE_OTHER): Payer: Medicare Other | Admitting: Family Medicine

## 2019-03-27 ENCOUNTER — Encounter: Payer: Self-pay | Admitting: Family Medicine

## 2019-03-27 VITALS — BP 127/78 | HR 68 | Ht 62.0 in

## 2019-03-27 DIAGNOSIS — E876 Hypokalemia: Secondary | ICD-10-CM

## 2019-03-27 DIAGNOSIS — R200 Anesthesia of skin: Secondary | ICD-10-CM

## 2019-03-27 DIAGNOSIS — M542 Cervicalgia: Secondary | ICD-10-CM

## 2019-03-27 DIAGNOSIS — K219 Gastro-esophageal reflux disease without esophagitis: Secondary | ICD-10-CM | POA: Diagnosis not present

## 2019-03-27 DIAGNOSIS — J029 Acute pharyngitis, unspecified: Secondary | ICD-10-CM

## 2019-03-27 DIAGNOSIS — J309 Allergic rhinitis, unspecified: Secondary | ICD-10-CM | POA: Diagnosis not present

## 2019-03-27 DIAGNOSIS — I10 Essential (primary) hypertension: Secondary | ICD-10-CM | POA: Diagnosis not present

## 2019-03-27 DIAGNOSIS — R202 Paresthesia of skin: Secondary | ICD-10-CM

## 2019-03-27 MED ORDER — PANTOPRAZOLE SODIUM 40 MG PO TBEC: 40.0000 mg | DELAYED_RELEASE_TABLET | Freq: Every day | ORAL | 3 refills | Status: DC

## 2019-03-27 MED ORDER — TIZANIDINE HCL 4 MG PO TABS: 4.0000 mg | ORAL_TABLET | Freq: Three times a day (TID) | ORAL | 1 refills | Status: DC | PRN

## 2019-03-27 MED ORDER — TIZANIDINE HCL 4 MG PO TABS: 4.0000 mg | ORAL_TABLET | Freq: Four times a day (QID) | ORAL | 0 refills | Status: DC | PRN

## 2019-03-27 MED ORDER — FLUTICASONE PROPIONATE 50 MCG/ACT NA SUSP: 2.0000 | Freq: Every day | NASAL | 3 refills | Status: DC

## 2019-03-27 NOTE — Progress Notes (Signed)
Virtual Visit via Video Note   I connected with Latasha Latasha Young on 03/27/19 by a video enabled telemedicine application and verified that I am speaking with the correct person using two identifiers.  Location patient: home Location provider:work office Persons participating in the virtual visit: patient, provider  I discussed the limitations of evaluation and management by telemedicine and the availability of in person appointments. The patient expressed understanding and agreed to proceed.   HPI: Latasha Young is a 68 years old who is being seen today for chronic disease management. Since her last visit she has followed with endocrinologist for multinodular goiter, Dr. Loanne Drilling. She was last seen, virtual visit, on 11/27/2018; when she was complaining about lower abdominal pain and headaches, these have resolved. Hypertension: Most BP readings at home 130's/80's, max 148/80's. Negative for visual changes, chest pain, dyspnea, palpitation, focal weakness, or edema.  Lab Results  Component Value Date   CREATININE 1.01 (H) 05/12/2018   BUN 10 05/12/2018   NA 136 05/12/2018   K 3.3 (L) 05/12/2018   CL 94 (L) 05/12/2018   CO2 24 05/12/2018   She has not been able to come to the lab due to sore throat. -She has had intermittent but daily sore throat since then his last 2019.  Sore throat is 7/10. It is exacerbated by prolonged talking and swallowing. Alleviated by drinking warm liquids. "Scratchy" to voice, this is worse in the morning and after long talking. + Rhinorrhea, nasal congestion, and postnasal drainage. No changes in the smell or taste. Negative for sick contacts. Negative for fever, chills, unusual fatigue, body aches,cough,wheezing, dysphagia,stridor,nausea, vomiting, or changes in bowel habits (certain foods/drinks cause diarrhea: Whole milk).  She has history of GERD, currently she is on omeprazole 40 mg daily.  Still having episodes of heartburn.  Also complaining about a month  of left upper extremity tingling, mainly affecting pain and it seems more noticeable in the morning when she first gets up. Shaking hand seems to help. + Left-sided neck pain, radiates to shoulder. No history of trauma.  She has tried icy hot for neck pain.. She has not noted a skin rash, weakness, or cyanosis.   ROS: See pertinent positives and negatives per HPI.  Past Medical History:  Diagnosis Date  . Allergy   . Cataract    "beginnings of"  . GERD (gastroesophageal reflux disease)   . Hyperlipidemia   . Hypertension   . Thyroid disease    2 benign nodules noted- check yearly  . Vitamin D deficiency     Past Surgical History:  Procedure Laterality Date  . ABDOMINAL HYSTERECTOMY    . SIGMOIDOSCOPY    . WISDOM TOOTH EXTRACTION      Family History  Problem Relation Age of Onset  . Stroke Mother   . Hypertension Mother   . Stroke Brother   . Hypertension Maternal Grandmother   . Hypertension Maternal Grandfather   . Alcohol abuse Brother   . Hypertension Brother   . Stroke Brother   . Thyroid disease Neg Hx   . Colon cancer Neg Hx   . Esophageal cancer Neg Hx   . Rectal cancer Neg Hx   . Stomach cancer Neg Hx     Social History   Socioeconomic History  . Marital status: Divorced    Spouse name: Not on file  . Number of children: Not on file  . Years of education: Not on file  . Highest education level: Not on file  Occupational History  .  Not on file  Tobacco Use  . Smoking status: Never Smoker  . Smokeless tobacco: Never Used  Substance and Sexual Activity  . Alcohol use: Yes    Comment: Occ  . Drug use: No  . Sexual activity: Not on file  Other Topics Concern  . Not on file  Social History Narrative  . Not on file   Social Determinants of Health   Financial Resource Strain:   . Difficulty of Paying Living Expenses: Not on file  Food Insecurity:   . Worried About Programme researcher, broadcasting/film/video in the Last Year: Not on file  . Ran Out of Food in the  Last Year: Not on file  Transportation Needs:   . Lack of Transportation (Medical): Not on file  . Lack of Transportation (Non-Medical): Not on file  Physical Activity:   . Days of Exercise per Week: Not on file  . Minutes of Exercise per Session: Not on file  Stress:   . Feeling of Stress : Not on file  Social Connections:   . Frequency of Communication with Friends and Family: Not on file  . Frequency of Social Gatherings with Friends and Family: Not on file  . Attends Religious Services: Not on file  . Active Member of Clubs or Organizations: Not on file  . Attends Banker Meetings: Not on file  . Marital Status: Not on file  Intimate Partner Violence:   . Fear of Current or Ex-Partner: Not on file  . Emotionally Abused: Not on file  . Physically Abused: Not on file  . Sexually Abused: Not on file    Current Outpatient Medications:  .  atorvastatin (LIPITOR) 40 MG tablet, Take 1 tablet by mouth once daily, Disp: 90 tablet, Rfl: 0 .  Cholecalciferol (VITAMIN D3) 50000 units TABS, Take by mouth once a week., Disp: , Rfl:  .  lisinopril (PRINIVIL,ZESTRIL) 40 MG tablet, TAKE 1 TABLET BY MOUTH ONCE DAILY, Disp: 90 tablet, Rfl: 1 .  metoprolol succinate (TOPROL-XL) 25 MG 24 hr tablet, Take 1 tablet (25 mg total) by mouth daily., Disp: 90 tablet, Rfl: 0 .  Multiple Vitamins-Minerals (MULTIVITAMIN ADULT PO), Take by mouth daily., Disp: , Rfl:  .  triamterene-hydrochlorothiazide (MAXZIDE-25) 37.5-25 MG tablet, Take 1 tablet by mouth once daily, Disp: 90 tablet, Rfl: 0 .  fluticasone (FLONASE) 50 MCG/ACT nasal spray, Place 2 sprays into both nostrils daily., Disp: 16 g, Rfl: 3 .  pantoprazole (PROTONIX) 40 MG tablet, Take 1 tablet (40 mg total) by mouth daily., Disp: 30 tablet, Rfl: 3 .  tiZANidine (ZANAFLEX) 4 MG tablet, Take 1 tablet (4 mg total) by mouth every 8 (eight) hours as needed for muscle spasms., Disp: 30 tablet, Rfl: 1  EXAM:  VITALS per patient if applicable:BP  127/78   Pulse 68   Ht 5\' 2"  (1.575 m)   BMI 30.14 kg/m   GENERAL: alert, oriented, appears well and in no acute distress  HEENT: atraumatic, normocephalic, conjunctiva clear, no obvious abnormalities on inspection.  NECK: Left side rotation mildly limited, it elicits pain.  LUNGS: on inspection no signs of respiratory distress, breathing rate appears normal, no obvious gross SOB, gasping or wheezing. No cough during visit.  CV: no obvious cyanosis  Latasha: moves all visible extremities without noticeable abnormality. Tingling and numbness are not worse with Phalen maneuver.   PSYCH/NEURO: pleasant and cooperative, no obvious depression or anxiety, speech and thought processing grossly intact  ASSESSMENT AND PLAN:  Discussed the following assessment  and plan:  Sore throat Possible etiology discussed. Given the fact problem has been going on for a few weeks, I do not think it is an infectious problem. ?  GERD, allergies. If problem is persistent we will need to consider ENT evaluation.  Hypertension, essential, benign BP is adequately controlled. No changes in current management. Continue monitoring BP regularly and low salt diet. Because of COVI 19 we will hold on lab until next visit.  Gastroesophageal reflux disease, unspecified whether esophagitis present - Plan: pantoprazole (PROTONIX) 40 MG tablet Problem is not well controlled. Stop omeprazole. She will try Protonix 40 mg daily, 30 minutes before breakfast. Continue GERD precautions.  Allergic rhinitis, unspecified seasonality, unspecified trigger - Plan: fluticasone (FLONASE) 50 MCG/ACT nasal spray This problem can be contributing to sore throat. Recommend intranasal steroid and nasal irrigations with saline.  Cervical pain (neck) - Plan: tiZANidine (ZANAFLEX) 4 MG tablet, DISCONTINUED: tiZANidine (ZANAFLEX) 4 MG tablet Continue local massage and OTC IcyHot or Aspercreme. Range of motion exercises may also  help. Zanaflex recommended, we discussed some side effects.  Numbness and tingling of left hand ?  Radicular pain, carpal tunnel. Recommend trying a left wrist splint at night for about 4 to 6 weeks. If problem persist we need to consider cervical MRI.  Hypokalemia Mild. K+ rich diet for now. We will re-check next visit.   I discussed the assessment and treatment plan with the patient. She was provided an opportunity to ask questions and all were answered. She  agreed with the plan and demonstrated an understanding of the instructions.   The patient was advised to call back or seek an in-person evaluation if the symptoms worsen or if the condition fails to improve as anticipated.  Return in about 3 months (around 06/25/2019) for HTN,HLD,GERD.    Takela Varden [place], MD

## 2019-03-28 NOTE — Progress Notes (Signed)
Patient scheduled for 06/25/2019 at 10:30 AM

## 2019-04-10 ENCOUNTER — Other Ambulatory Visit: Payer: Self-pay

## 2019-04-10 ENCOUNTER — Ambulatory Visit
Admission: RE | Admit: 2019-04-10 | Discharge: 2019-04-10 | Disposition: A | Discharge status: Home / Self Care | Payer: Medicare Other | Source: Ambulatory Visit | Attending: Family Medicine | Admitting: Family Medicine

## 2019-04-10 DIAGNOSIS — Z1231 Encounter for screening mammogram for malignant neoplasm of breast: Secondary | ICD-10-CM

## 2019-06-05 ENCOUNTER — Other Ambulatory Visit: Payer: Self-pay | Admitting: Family Medicine

## 2019-06-05 DIAGNOSIS — I10 Essential (primary) hypertension: Secondary | ICD-10-CM

## 2019-06-10 ENCOUNTER — Ambulatory Visit: Payer: Medicare Other | Attending: Internal Medicine

## 2019-06-10 DIAGNOSIS — Z23 Encounter for immunization: Secondary | ICD-10-CM

## 2019-06-10 NOTE — Progress Notes (Signed)
   Covid-19 Vaccination Clinic  Name:  Avnoor Koury    MRN: [id number] DOB: [birth year]  06/10/2019  Ms. Chad was observed post Covid-19 immunization for 15 minutes without incident. She was provided with Vaccine Information Sheet and instruction to access the V-Safe system.   Ms. Hattabaugh was instructed to call 911 with any severe reactions post vaccine: Marland Kitchen Difficulty breathing  . Swelling of face and throat  . A fast heartbeat  . A bad rash all over body  . Dizziness and weakness   Immunizations Administered    Name Date Dose VIS Date Route   Pfizer COVID-19 Vaccine 06/10/2019  8:33 AM 0.3 mL 03/16/2019 Intramuscular   Manufacturer: [employer], [employer]   Lot: [id number]   NDC: [address]-7

## 2019-06-22 ENCOUNTER — Other Ambulatory Visit: Payer: Self-pay

## 2019-06-25 ENCOUNTER — Other Ambulatory Visit: Payer: Self-pay

## 2019-06-25 ENCOUNTER — Encounter: Payer: Self-pay | Admitting: Family Medicine

## 2019-06-25 ENCOUNTER — Ambulatory Visit (INDEPENDENT_AMBULATORY_CARE_PROVIDER_SITE_OTHER): Payer: Medicare Other | Admitting: Family Medicine

## 2019-06-25 VITALS — BP 138/80 | HR 78 | Resp 12 | Ht 62.0 in | Wt 168.0 lb

## 2019-06-25 DIAGNOSIS — R7303 Prediabetes: Secondary | ICD-10-CM

## 2019-06-25 DIAGNOSIS — I10 Essential (primary) hypertension: Secondary | ICD-10-CM | POA: Diagnosis not present

## 2019-06-25 DIAGNOSIS — E782 Mixed hyperlipidemia: Secondary | ICD-10-CM | POA: Diagnosis not present

## 2019-06-25 DIAGNOSIS — R1030 Lower abdominal pain, unspecified: Secondary | ICD-10-CM

## 2019-06-25 LAB — COMPREHENSIVE METABOLIC PANEL
ALT: 13 U/L (ref 0–35)
AST: 17 U/L (ref 0–37)
Albumin: 4.1 g/dL (ref 3.5–5.2)
Alkaline Phosphatase: 89 U/L (ref 39–117)
BUN: 10 mg/dL (ref 6–23)
CO2: 31 mEq/L (ref 19–32)
Calcium: 9.4 mg/dL (ref 8.4–10.5)
Chloride: 95 mEq/L — ABNORMAL LOW (ref 96–112)
Creatinine, Ser: 1 mg/dL (ref 0.40–1.20)
GFR: 66.24 mL/min (ref >60.00)
Glucose, Bld: 97 mg/dL (ref 70–99)
Potassium: 3.6 mEq/L (ref 3.5–5.1)
Sodium: 135 mEq/L (ref 135–145)
Total Bilirubin: 2.6 mg/dL — ABNORMAL HIGH (ref 0.2–1.2)
Total Protein: 7 g/dL (ref 6.0–8.3)

## 2019-06-25 LAB — LIPID PANEL
Cholesterol: 160 mg/dL (ref 0–200)
HDL: 47 mg/dL (ref >39.00)
LDL Cholesterol: 94 mg/dL (ref 0–99)
NonHDL: 113.32
Total CHOL/HDL Ratio: 3
Triglycerides: 99 mg/dL (ref 0.0–149.0)
VLDL: 19.8 mg/dL (ref 0.0–40.0)

## 2019-06-25 LAB — HEMOGLOBIN A1C: Hgb A1c MFr Bld: 6.1 % (ref 4.6–6.5)

## 2019-06-25 MED ORDER — AMLODIPINE BESY-BENAZEPRIL HCL 5-20 MG PO CAPS: 1.0000 | ORAL_CAPSULE | Freq: Every day | ORAL | 2 refills | Status: DC

## 2019-06-25 NOTE — Patient Instructions (Addendum)
A few things to remember from today's visit:  Stop Lisinopril. Start combination tab: Benazepril and Amlodipine to take at bedtimes. Low salt diet. No changes on Metoprolol or Maxzide.   Please be sure medication list is accurate. If a new problem present, please set up appointment sooner than planned today.

## 2019-06-25 NOTE — Assessment & Plan Note (Signed)
Home BP readings mildly elevated. Lisinopril discontinued. Amlodipine-benazepril 5-20 mg started today, recommend taking medication at night. No changes in metoprolol succinate or Maxide. Continue monitoring BP at home and following low-salt diet. Recommend bringing BP monitor next visit.

## 2019-06-25 NOTE — Assessment & Plan Note (Signed)
Continue Atorvastatin 40 mg daily. Low fat diet also recommended. Further recommendations according to lipid panel results.

## 2019-06-25 NOTE — Assessment & Plan Note (Signed)
A healthy lifestyle recommended for primary prevention of diabetes. Further recommendation will be given according to A1c results.

## 2019-06-25 NOTE — Progress Notes (Signed)
HPI:   [name] is a 69 y.o. female, who is here today for chronic disease management.  Last was last seen on 03/27/19, virtual visit. No new problems since her last visit.  Hypertension: Currently she is on triamterene-HCTZ 37.5-25 mg, metoprolol succinate 25 mg daily, and lisinopril 40 mg daily. Lisinopril is making her sick, this is going on for a while.  She has been taking medication as instructed. Negative for severe/frequent headache, visual changes, chest pain, dyspnea, palpitation, claudication, focal weakness, or edema.  She is also concerned about some elevated BPs at home: 141-145/80s.   Lab Results  Component Value Date   CREATININE 1.01 (H) 05/12/2018   BUN 10 05/12/2018   NA 136 05/12/2018   K 3.3 (L) 05/12/2018   CL 94 (L) 05/12/2018   CO2 24 05/12/2018    2 days ago she started with lower abdominal cramps, no radiated, intermittent. Pain started the day after she ate chicken prepared by a friend.  Passing gas helped, today she is not having pain. She has not noted changes in bowel habits. She is having bowel movements daily. Negative for blood in the stool, N/V, dysuria, gross hematuria, or increased urinary frequency.    She was also having abdominal pain in 03/2018 but she thinks it was different.  Hyperlipidemia: Currently she is on atorvastatin 40 mg daily.  Lab Results  Component Value Date   CHOL 169 10/14/2017   HDL 37.90 (L) 10/14/2017   LDLCALC 110 (H) 10/14/2017   TRIG 106.0 10/14/2017   CHOLHDL 4 10/14/2017    Prediabetes:Negative for polydipsia,polyuria, or polyphagia.  Lab Results  Component Value Date   HGBA1C 6.3 08/23/2016   Review of Systems  Constitutional: Negative for activity change, appetite change, fatigue and fever.  HENT: Negative for mouth sores, nosebleeds, sore throat and trouble swallowing.   Respiratory: Negative for cough and wheezing.   Gastrointestinal:       Negative for changes in bowel  habits.  Musculoskeletal: Negative for gait problem and myalgias.  Neurological: Negative for syncope, facial asymmetry and speech difficulty.  Psychiatric/Behavioral: Negative for confusion. The patient is nervous/anxious.   Rest of ROS, see pertinent positives sand negatives in HPI  Current Outpatient Medications on File Prior to Visit  Medication Sig Dispense Refill  . atorvastatin (LIPITOR) 40 MG tablet Take 1 tablet by mouth once daily 90 tablet 0  . Cholecalciferol (VITAMIN D3) 50000 units TABS Take by mouth once a week.    . fluticasone (FLONASE) 50 MCG/ACT nasal spray Place 2 sprays into both nostrils daily. 16 g 3  . metoprolol succinate (TOPROL-XL) 25 MG 24 hr tablet Take 1 tablet (25 mg total) by mouth daily. 90 tablet 0  . Multiple Vitamins-Minerals (MULTIVITAMIN ADULT PO) Take by mouth daily.    . pantoprazole (PROTONIX) 40 MG tablet Take 1 tablet (40 mg total) by mouth daily. 30 tablet 3  . tiZANidine (ZANAFLEX) 4 MG tablet Take 1 tablet (4 mg total) by mouth every 8 (eight) hours as needed for muscle spasms. 30 tablet 1  . triamterene-hydrochlorothiazide (MAXZIDE-25) 37.5-25 MG tablet Take 1 tablet by mouth once daily 90 tablet 3   No current facility-administered medications on file prior to visit.     Past Medical History:  Diagnosis Date  . Allergy   . Cataract    "beginnings of"  . GERD (gastroesophageal reflux disease)   . Hyperlipidemia   . Hypertension   . Thyroid disease  2 benign nodules noted- check yearly  . Vitamin D deficiency    Allergies  Allergen Reactions  . Penicillins Hives    Social History   Socioeconomic History  . Marital status: Divorced    Spouse name: Not on file  . Number of children: Not on file  . Years of education: Not on file  . Highest education level: Not on file  Occupational History  . Not on file  Tobacco Use  . Smoking status: Never Smoker  . Smokeless tobacco: Never Used  Substance and Sexual Activity  .  Alcohol use: Yes    Comment: Occ  . Drug use: No  . Sexual activity: Not on file  Other Topics Concern  . Not on file  Social History Narrative  . Not on file   Social Determinants of Health   Financial Resource Strain:   . Difficulty of Paying Living Expenses:   Food Insecurity:   . Worried About Programme researcher, broadcasting/film/video in the Last Year:   . Barista in the Last Year:   Transportation Needs:   . Freight forwarder (Medical):   Marland Kitchen Lack of Transportation (Non-Medical):   Physical Activity:   . Days of Exercise per Week:   . Minutes of Exercise per Session:   Stress:   . Feeling of Stress :   Social Connections:   . Frequency of Communication with Friends and Family:   . Frequency of Social Gatherings with Friends and Family:   . Attends Religious Services:   . Active Member of Clubs or Organizations:   . Attends Banker Meetings:   Marland Kitchen Marital Status:     Vitals:   06/25/19 1022  BP: 138/80  Pulse: 78  Resp: 12  SpO2: 98%   Body mass index is 30.73 kg/m.  Physical Exam  Nursing note and vitals reviewed. Constitutional: She is oriented to person, place, and time. She appears well-developed. No distress.  HENT:  Head: Normocephalic and atraumatic.  Mouth/Throat: Oropharynx is clear and moist and mucous membranes are normal.  Eyes: Pupils are equal, round, and reactive to light. Conjunctivae are normal.  Cardiovascular: Normal rate and regular rhythm.  No murmur heard. Pulses:      Dorsalis pedis pulses are 2+ on the right side and 2+ on the left side.  Respiratory: Effort normal and breath sounds normal. No respiratory distress.  GI: Soft. She exhibits no mass. There is no hepatomegaly. There is no abdominal tenderness.  Musculoskeletal:        General: No edema.  Lymphadenopathy:    She has no cervical adenopathy.  Neurological: She is alert and oriented to person, place, and time. She has normal strength. No cranial nerve deficit. Gait  normal.  Skin: Skin is warm. No rash noted. No erythema.  Psychiatric: She has a normal mood and affect.  Well groomed, good eye contact.   ASSESSMENT AND PLAN:   Latasha Young was seen today for chronic disease management.  Orders Placed This Encounter  Procedures  . Comprehensive metabolic panel  . Hemoglobin A1c  . Lipid panel   Lab Results  Component Value Date   HGBA1C 6.1 06/25/2019   Lab Results  Component Value Date   CREATININE 1.00 06/25/2019   BUN 10 06/25/2019   NA 135 06/25/2019   K 3.6 06/25/2019   CL 95 (L) 06/25/2019   CO2 31 06/25/2019   Lab Results  Component Value Date   ALT 13  06/25/2019   AST 17 06/25/2019   ALKPHOS 89 06/25/2019   BILITOT 2.6 (H) 06/25/2019   Lab Results  Component Value Date   CHOL 160 06/25/2019   HDL 47.00 06/25/2019   LDLCALC 94 06/25/2019   TRIG 99.0 06/25/2019   CHOLHDL 3 06/25/2019    Abdominal pain, lower Pain has resolved. We discussed possible etiologies. Recommend following if problem becomes recurrent. Adequate fluid and fiber intake.  Hyperlipidemia Continue Atorvastatin 40 mg daily. Low fat diet also recommended. Further recommendations according to lipid panel results.  Hypertension, essential, benign Home BP readings mildly elevated. Lisinopril discontinued. Amlodipine-benazepril 5-20 mg started today, recommend taking medication at night. No changes in metoprolol succinate or Maxide. Continue monitoring BP at home and following low-salt diet. Recommend bringing BP monitor next visit.  Prediabetes A healthy lifestyle recommended for primary prevention of diabetes. Further recommendation will be given according to A1c results.    Return in about 4 months (around 10/25/2019) for HTN.  Roneisha Stern G. [place], MD  [facility]. [place] office.   A few things to remember from today's visit:  Stop Lisinopril. Start combination tab: Benazepril and Amlodipine to take at  bedtimes. Low salt diet. No changes on Metoprolol or Maxzide.   Please be sure medication list is accurate. If a new problem present, please set up appointment sooner than planned today.

## 2019-06-26 IMAGING — CT CT HEART MORP W/ CTA COR W/ SCORE W/ CA W/CM &/OR W/O CM
4 of 7 series · 8 of 20 positions shown, 9 images · IV contrast (APPLIED)
Comparison: None.

Addendum:
EXAM:
OVER-READ INTERPRETATION  CT CHEST

The following report is an over-read performed by radiologist Dr.
Rony Eduardo Novella [REDACTED] on 05/17/2018. This
over-read does not include interpretation of cardiac or coronary
anatomy or pathology. The coronary calcium score/coronary CTA
interpretation by the cardiologist is attached.
CLINICAL DATA: Chest pain
Cardiac CTA
MEDICATIONS:
Sub lingual nitro. 4 mg and lopressor mg
TECHNIQUE: The patient was scanned on a Siemens Force 192 scanner. Gantry
rotation speed was 250 msecs. Collimation was. 6 mm . A 120 kV
prospective scan was triggered in the ascending thoracic aorta at
140 HU's with full mA between 30-70% of the R-R interval . Average
HR during the scan was bpm. The 3D data set was interpreted on a
dedicated work station using MPR, MIP and VRT modes. A total of 80
cc of contrast was used.

[Series 6: best diast 75 % · axial · 0.39mm/px · z∈[+1057,+1094]mm · 2 of 273 slices shown, 3 images]
[im 91/273  vessel]
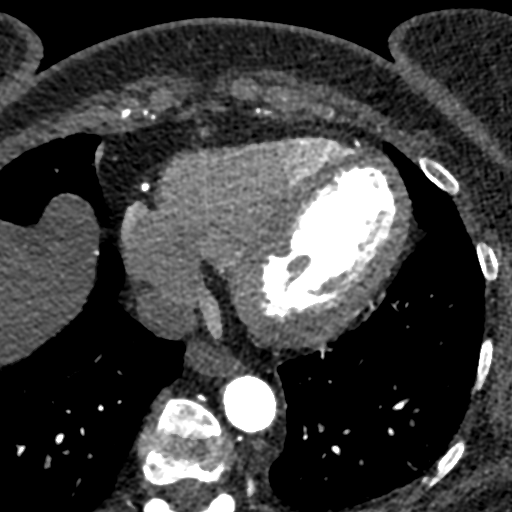
[im 91/273  lung]
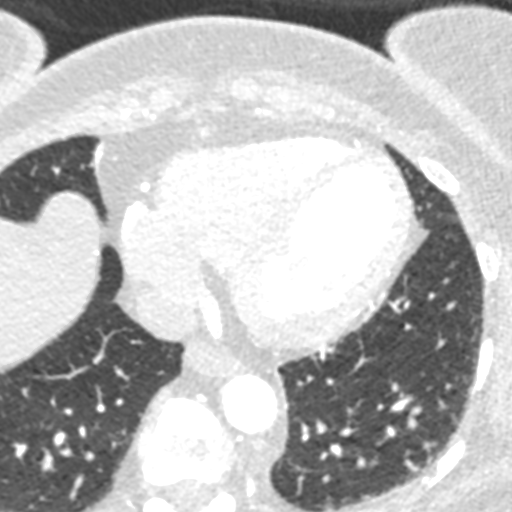
[im 182/273  vessel]
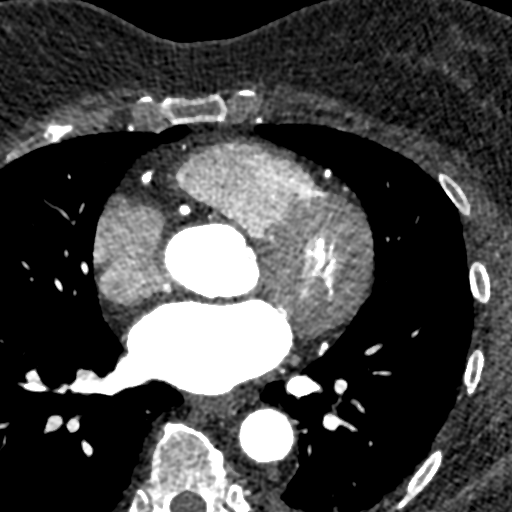

[Series 7: best syst 43 % · axial · 0.39mm/px · z∈[+1057,+1094]mm · 2 of 273 slices shown]
[im 91/273  vessel]
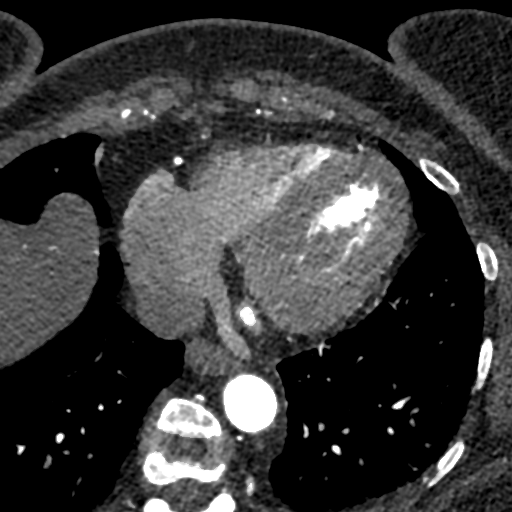
[im 182/273  vessel]
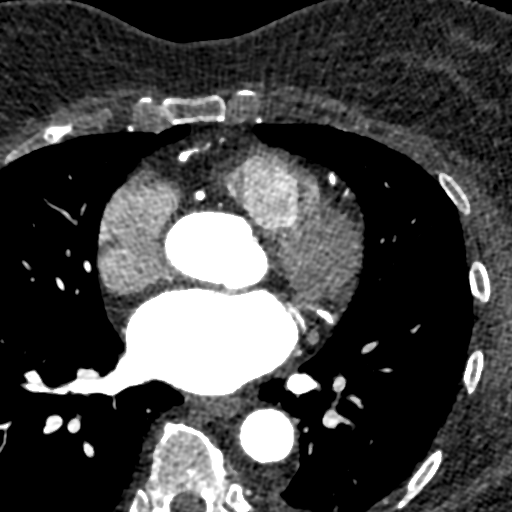

[Series 8: ts diast sharp 75 % · axial · 0.39mm/px · z∈[+1057,+1094]mm · 2 of 273 slices shown]
[im 91/273  lung]
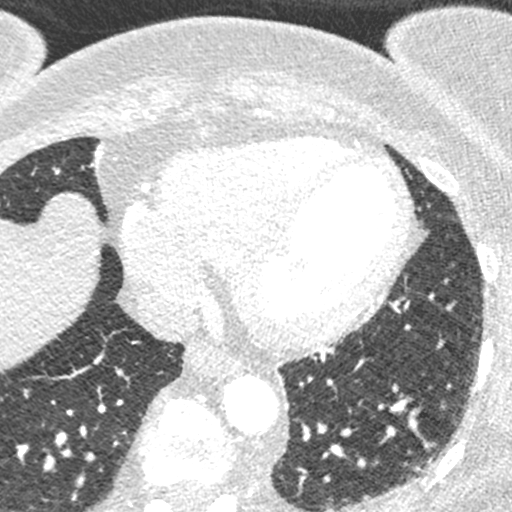
[im 182/273  lung]
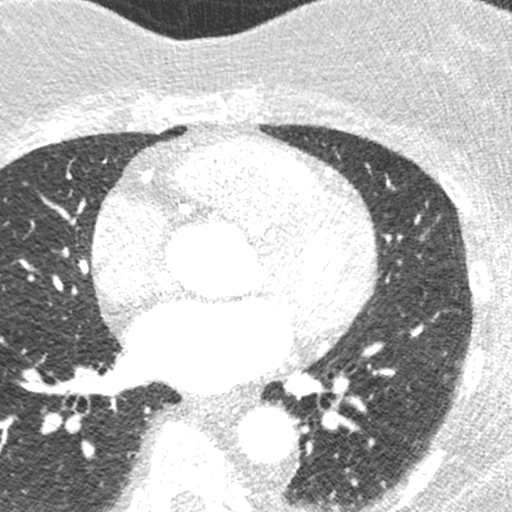

[Series 9: ts syst sharp 43 % · axial · 0.39mm/px · z∈[+1057,+1094]mm · 2 of 273 slices shown]
[im 91/273  lung]
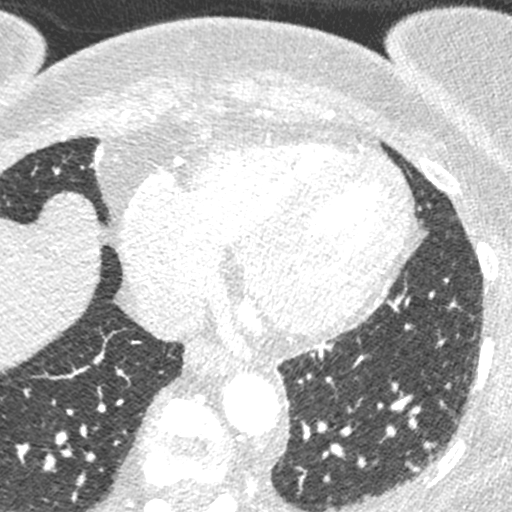
[im 182/273  lung]
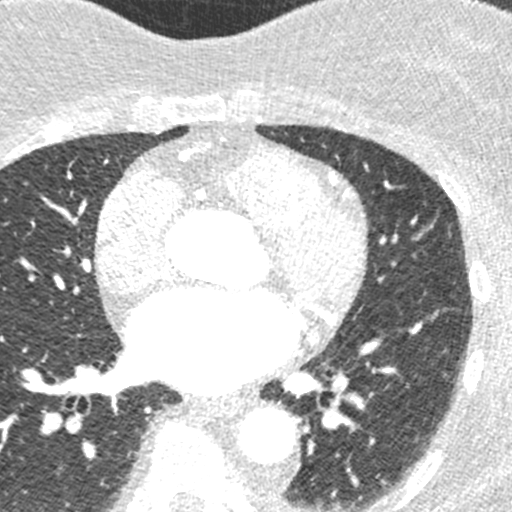

[8 of 20 positions shown; findings below may reference images not displayed]

FINDINGS: Aortic atherosclerosis. Within the visualized portions of the thorax
there are no suspicious appearing pulmonary nodules or masses, there
is no acute consolidative airspace disease, no pleural effusions, no
pneumothorax and no lymphadenopathy. Visualized portions of the
upper abdomen are unremarkable. There are no aggressive appearing
lytic or blastic lesions noted in the visualized portions of the
skeleton.
IMPRESSION: 1.  Aortic Atherosclerosis ([id number]).
FINDINGS: Non-cardiac: See separate report from [REDACTED]. No
significant findings on limited lung and soft tissue windows.

Calcium score: No calcium noted

Coronary Arteries: Right dominant with no anomalies

LM: Normal

LAD: Normal

D1: Normal

D2: Normal

D3: Normal

Circumflex: Normal

OM1: Normal

OM2: Normal

RCA: Normal

PDA: Normal

PLA: Normal
IMPRESSION: 1.  Normal right dominant coronary arteries

2.  Calcium score 0

3.  Normal aortic root diameter 3.7 cm

Moniik Mike

*** End of Addendum ***

## 2019-06-27 ENCOUNTER — Ambulatory Visit: Payer: Medicare Other

## 2019-07-10 ENCOUNTER — Other Ambulatory Visit: Payer: Self-pay | Admitting: Family Medicine

## 2019-07-10 DIAGNOSIS — I10 Essential (primary) hypertension: Secondary | ICD-10-CM

## 2019-07-11 ENCOUNTER — Other Ambulatory Visit (INDEPENDENT_AMBULATORY_CARE_PROVIDER_SITE_OTHER): Payer: Medicare Other

## 2019-07-11 ENCOUNTER — Other Ambulatory Visit: Payer: Self-pay

## 2019-07-11 LAB — CBC WITH DIFFERENTIAL/PLATELET
Basophils Absolute: 0 10*3/uL (ref 0.0–0.1)
Basophils Relative: 0.5 % (ref 0.0–3.0)
Eosinophils Absolute: 0.2 10*3/uL (ref 0.0–0.7)
Eosinophils Relative: 3.4 % (ref 0.0–5.0)
HCT: 42.5 % (ref 36.0–46.0)
Hemoglobin: 14.2 g/dL (ref 12.0–15.0)
Lymphocytes Relative: 41.1 % (ref 12.0–46.0)
Lymphs Abs: 2.3 10*3/uL (ref 0.7–4.0)
MCHC: 33.3 g/dL (ref 30.0–36.0)
MCV: 90.3 fl (ref 78.0–100.0)
Monocytes Absolute: 0.4 10*3/uL (ref 0.1–1.0)
Monocytes Relative: 7.6 % (ref 3.0–12.0)
Neutro Abs: 2.6 10*3/uL (ref 1.4–7.7)
Neutrophils Relative %: 47.4 % (ref 43.0–77.0)
Platelets: 324 10*3/uL (ref 150.0–400.0)
RBC: 4.71 Mil/uL (ref 3.87–5.11)
RDW: 13.2 % (ref 11.5–15.5)
WBC: 5.6 10*3/uL (ref 4.0–10.5)

## 2019-07-11 LAB — HEPATIC FUNCTION PANEL
ALT: 15 U/L (ref 0–35)
AST: 16 U/L (ref 0–37)
Albumin: 4.4 g/dL (ref 3.5–5.2)
Alkaline Phosphatase: 78 U/L (ref 39–117)
Bilirubin, Direct: 0.2 mg/dL (ref 0.0–0.3)
Total Bilirubin: 1.5 mg/dL — ABNORMAL HIGH (ref 0.2–1.2)
Total Protein: 6.9 g/dL (ref 6.0–8.3)

## 2019-07-13 ENCOUNTER — Encounter: Payer: Self-pay | Admitting: Family Medicine

## 2019-07-15 ENCOUNTER — Encounter: Payer: Self-pay | Admitting: Family Medicine

## 2019-10-22 ENCOUNTER — Telehealth: Payer: Self-pay | Admitting: Family Medicine

## 2019-10-22 NOTE — Telephone Encounter (Signed)
Left message for patient to schedule Annual Wellness Visit.  Please schedule with Nurse Health Advisor Shannon Crews, RN at [facility]  

## 2019-10-29 ENCOUNTER — Other Ambulatory Visit: Payer: Self-pay

## 2019-10-29 ENCOUNTER — Ambulatory Visit: Payer: Medicare Other | Admitting: Family Medicine

## 2019-10-29 ENCOUNTER — Ambulatory Visit: Payer: Medicare Other

## 2019-10-31 ENCOUNTER — Other Ambulatory Visit: Payer: Self-pay

## 2019-10-31 ENCOUNTER — Ambulatory Visit (INDEPENDENT_AMBULATORY_CARE_PROVIDER_SITE_OTHER): Payer: Medicare Other | Admitting: Family Medicine

## 2019-10-31 ENCOUNTER — Encounter: Payer: Self-pay | Admitting: Family Medicine

## 2019-10-31 VITALS — BP 122/70 | HR 76 | Wt 162.2 lb

## 2019-10-31 DIAGNOSIS — K120 Recurrent oral aphthae: Secondary | ICD-10-CM

## 2019-10-31 DIAGNOSIS — R350 Frequency of micturition: Secondary | ICD-10-CM | POA: Diagnosis not present

## 2019-10-31 DIAGNOSIS — N390 Urinary tract infection, site not specified: Secondary | ICD-10-CM | POA: Diagnosis not present

## 2019-10-31 LAB — POCT URINALYSIS DIPSTICK
Bilirubin, UA: NEGATIVE
Blood, UA: POSITIVE
Glucose, UA: NEGATIVE
Ketones, UA: NEGATIVE
Nitrite, UA: NEGATIVE
Protein, UA: NEGATIVE
Spec Grav, UA: 1.015 (ref 1.010–1.025)
Urobilinogen, UA: 0.2 E.U./dL
pH, UA: 8 (ref 5.0–8.0)

## 2019-10-31 MED ORDER — CIPROFLOXACIN HCL 500 MG PO TABS: 500.0000 mg | ORAL_TABLET | Freq: Two times a day (BID) | ORAL | 0 refills | Status: DC

## 2019-10-31 NOTE — Progress Notes (Signed)
   Subjective:    Patient ID: Latasha Young, female    DOB: 10-01-1950, 69 y.o.   MRN: [id number]  HPI Here for several issues. First about 2 weeks ago she began to have urinary urgency and burning. No back pain or fever. Then on 10-26-19 she saw a dentist at [facility] about an implant on the right lower jaw. He trimmed away some gum tissue and placed some sutures. As he was doing this she felt him stick something into the bottom of her tongue which was sharp. Then the next day she developed swelling and severe pain in the bottom of her tongue. She had some left over Clindamycin at home and she started taking this 2 days ago.    Review of Systems  Constitutional: Negative.   HENT: Positive for mouth sores. Negative for facial swelling, sore throat and trouble swallowing.   Eyes: Negative.   Respiratory: Negative.   Cardiovascular: Negative.   Genitourinary: Positive for dysuria, frequency and urgency. Negative for flank pain and hematuria.       Objective:   Physical Exam Constitutional:      Comments: In pain   HENT:     Mouth/Throat:     Comments: The right lower jaw has an implant stud with several rows of sutures around the base. This is clean and there is no sign of infection. There is also a large tender aphthous ulcer on the right side of the bottom of the tongue. Neck:     Comments: Several small tender AC nodes under the right jaw  Cardiovascular:     Rate and Rhythm: Normal rate and regular rhythm.     Pulses: Normal pulses.     Heart sounds: Normal heart sounds.  Pulmonary:     Effort: Pulmonary effort is normal.     Breath sounds: Normal breath sounds.  Abdominal:     General: Abdomen is flat. Bowel sounds are normal. There is no distension.     Palpations: Abdomen is soft. There is no mass.     Tenderness: There is no abdominal tenderness. There is no guarding or rebound.     Hernia: No hernia is present.  Neurological:     Mental Status: She is alert.            Assessment & Plan:  She has 2 problems. First she has a UTI and I explained that the Clindamycin was not covering this. She will stop the Clindamycin and start 7 days of Cipro. Culture the sample. Drink plenty of water. The source of her mouth pain is a large aphthous ulcer that was the result of some trauma during her recent dental procedure. There is no infection in her mouth, so again she can stop taking Clindamycin. She may use warm salt water rinses to help her mouth feel better. The ulcer will resolve in the next week or two.  Gershon Crane, MD

## 2019-11-02 LAB — URINE CULTURE
MICRO NUMBER:: 10759668
SPECIMEN QUALITY:: ADEQUATE

## 2019-11-07 ENCOUNTER — Telehealth: Payer: Self-pay | Admitting: Family Medicine

## 2019-11-07 NOTE — Telephone Encounter (Signed)
Left message for patient to schedule Annual Wellness Visit.  Please schedule with Nurse Health Advisor Shannon Crews, RN at [facility]  

## 2019-11-16 ENCOUNTER — Ambulatory Visit (INDEPENDENT_AMBULATORY_CARE_PROVIDER_SITE_OTHER): Payer: Medicare Other

## 2019-11-16 ENCOUNTER — Other Ambulatory Visit: Payer: Self-pay

## 2019-11-16 DIAGNOSIS — Z0000 Encounter for general adult medical examination without abnormal findings: Secondary | ICD-10-CM

## 2019-11-16 NOTE — Patient Instructions (Signed)
Latasha Young , Thank you for taking time to come for your Medicare Wellness Visit. I appreciate your ongoing commitment to your health goals. Please review the following plan we discussed and let me know if I can assist you in the future.   Screening recommendations/referrals: Colonoscopy: Up to date, next due 04/25/2028 Mammogram: Up to date, next due 04/10/2019 Bone Density: No longer required  Recommended yearly ophthalmology/optometry visit for glaucoma screening and checkup Recommended yearly dental visit for hygiene and checkup  Vaccinations: Influenza vaccine: Up to date, next due Fall of this year  Pneumococcal vaccine: completed series Tdap vaccine: Currently due, please check with your insurance company regarding cost or await injury Shingles vaccine: Currently due, please check with your pharmacy regarding cost and to receive vaccine    Advanced directives: Advance directive discussed with you today. Even though you declined this today please call our office should you change your mind and we can give you the proper paperwork for you to fill out.   Conditions/risks identified: None   Next appointment: None    Preventive Care 65 Years and Older, Female Preventive care refers to lifestyle choices and visits with your health care provider that can promote health and wellness. What does preventive care include?  A yearly physical exam. This is also called an annual well check.  Dental exams once or twice a year.  Routine eye exams. Ask your health care provider how often you should have your eyes checked.  Personal lifestyle choices, including:  Daily care of your teeth and gums.  Regular physical activity.  Eating a healthy diet.  Avoiding tobacco and drug use.  Limiting alcohol use.  Practicing safe sex.  Taking low-dose aspirin every day.  Taking vitamin and mineral supplements as recommended by your health care provider. What happens during an annual well  check? The services and screenings done by your health care provider during your annual well check will depend on your age, overall health, lifestyle risk factors, and family history of disease. Counseling  Your health care provider may ask you questions about your:  Alcohol use.  Tobacco use.  Drug use.  Emotional well-being.  Home and relationship well-being.  Sexual activity.  Eating habits.  History of falls.  Memory and ability to understand (cognition).  Work and work Astronomer.  Reproductive health. Screening  You may have the following tests or measurements:  Height, weight, and BMI.  Blood pressure.  Lipid and cholesterol levels. These may be checked every 5 years, or more frequently if you are over 22 years old.  Skin check.  Lung cancer screening. You may have this screening every year starting at age 8 if you have a 30-pack-year history of smoking and currently smoke or have quit within the past 15 years.  Fecal occult blood test (FOBT) of the stool. You may have this test every year starting at age 76.  Flexible sigmoidoscopy or colonoscopy. You may have a sigmoidoscopy every 5 years or a colonoscopy every 10 years starting at age 34.  Hepatitis C blood test.  Hepatitis B blood test.  Sexually transmitted disease (STD) testing.  Diabetes screening. This is done by checking your blood sugar (glucose) after you have not eaten for a while (fasting). You may have this done every 1-3 years.  Bone density scan. This is done to screen for osteoporosis. You may have this done starting at age 2.  Mammogram. This may be done every 1-2 years. Talk to your health care provider  about how often you should have regular mammograms. Talk with your health care provider about your test results, treatment options, and if necessary, the need for more tests. Vaccines  Your health care provider may recommend certain vaccines, such as:  Influenza vaccine. This is  recommended every year.  Tetanus, diphtheria, and acellular pertussis (Tdap, Td) vaccine. You may need a Td booster every 10 years.  Zoster vaccine. You may need this after age 66.  Pneumococcal 13-valent conjugate (PCV13) vaccine. One dose is recommended after age 6.  Pneumococcal polysaccharide (PPSV23) vaccine. One dose is recommended after age 69. Talk to your health care provider about which screenings and vaccines you need and how often you need them. This information is not intended to replace advice given to you by your health care provider. Make sure you discuss any questions you have with your health care provider. Document Released: 04/18/2015 Document Revised: 12/10/2015 Document Reviewed: 01/21/2015 Elsevier Interactive Patient Education  2017 [place] Prevention in the Home Falls can cause injuries. They can happen to people of all ages. There are many things you can do to make your home safe and to help prevent falls. What can I do on the outside of my home?  Regularly fix the edges of walkways and driveways and fix any cracks.  Remove anything that might make you trip as you walk through a door, such as a raised step or threshold.  Trim any bushes or trees on the path to your home.  Use bright outdoor lighting.  Clear any walking paths of anything that might make someone trip, such as rocks or tools.  Regularly check to see if handrails are loose or broken. Make sure that both sides of any steps have handrails.  Any raised decks and porches should have guardrails on the edges.  Have any leaves, snow, or ice cleared regularly.  Use sand or salt on walking paths during winter.  Clean up any spills in your garage right away. This includes oil or grease spills. What can I do in the bathroom?  Use night lights.  Install grab bars by the toilet and in the tub and shower. Do not use towel bars as grab bars.  Use non-skid mats or decals in the tub or  shower.  If you need to sit down in the shower, use a plastic, non-slip stool.  Keep the floor dry. Clean up any water that spills on the floor as soon as it happens.  Remove soap buildup in the tub or shower regularly.  Attach bath mats securely with double-sided non-slip rug tape.  Do not have throw rugs and other things on the floor that can make you trip. What can I do in the bedroom?  Use night lights.  Make sure that you have a light by your bed that is easy to reach.  Do not use any sheets or blankets that are too big for your bed. They should not hang down onto the floor.  Have a firm chair that has side arms. You can use this for support while you get dressed.  Do not have throw rugs and other things on the floor that can make you trip. What can I do in the kitchen?  Clean up any spills right away.  Avoid walking on wet floors.  Keep items that you use a lot in easy-to-reach places.  If you need to reach something above you, use a strong step stool that has a grab bar.  Keep electrical cords out of the way.  Do not use floor polish or wax that makes floors slippery. If you must use wax, use non-skid floor wax.  Do not have throw rugs and other things on the floor that can make you trip. What can I do with my stairs?  Do not leave any items on the stairs.  Make sure that there are handrails on both sides of the stairs and use them. Fix handrails that are broken or loose. Make sure that handrails are as long as the stairways.  Check any carpeting to make sure that it is firmly attached to the stairs. Fix any carpet that is loose or worn.  Avoid having throw rugs at the top or bottom of the stairs. If you do have throw rugs, attach them to the floor with carpet tape.  Make sure that you have a light switch at the top of the stairs and the bottom of the stairs. If you do not have them, ask someone to add them for you. What else can I do to help prevent  falls?  Wear shoes that:  Do not have high heels.  Have rubber bottoms.  Are comfortable and fit you well.  Are closed at the toe. Do not wear sandals.  If you use a stepladder:  Make sure that it is fully opened. Do not climb a closed stepladder.  Make sure that both sides of the stepladder are locked into place.  Ask someone to hold it for you, if possible.  Clearly mark and make sure that you can see:  Any grab bars or handrails.  First and last steps.  Where the edge of each step is.  Use tools that help you move around (mobility aids) if they are needed. These include:  Canes.  Walkers.  Scooters.  Crutches.  Turn on the lights when you go into a dark area. Replace any light bulbs as soon as they burn out.  Set up your furniture so you have a clear path. Avoid moving your furniture around.  If any of your floors are uneven, fix them.  If there are any pets around you, be aware of where they are.  Review your medicines with your doctor. Some medicines can make you feel dizzy. This can increase your chance of falling. Ask your doctor what other things that you can do to help prevent falls. This information is not intended to replace advice given to you by your health care provider. Make sure you discuss any questions you have with your health care provider. Document Released: 01/16/2009 Document Revised: 08/28/2015 Document Reviewed: 04/26/2014 Elsevier Interactive Patient Education  2017 [employer].

## 2019-11-16 NOTE — Progress Notes (Signed)
Subjective:   Latasha Young is a 69 y.o. female who presents for an Initial Medicare Annual Wellness Visit.   I connected with Latasha Young  today by telephone and verified that I am speaking with the correct person using two identifiers. Location patient: home Location provider: work Persons participating in the virtual visit: patient, provider.   I discussed the limitations, risks, security and privacy concerns of performing an evaluation and management service by telephone and the availability of in person appointments. I also discussed with the patient that there may be a patient responsible charge related to this service. The patient expressed understanding and verbally consented to this telephonic visit.    Interactive audio and video telecommunications were attempted between this provider and patient, however failed, due to patient having technical difficulties OR patient did not have access to video capability.  We continued and completed visit with audio only.     Review of Systems    N/A       Objective:    Today's Vitals   There is no height or weight on file to calculate BMI.  Advanced Directives 11/16/2019 10/12/2017 10/12/2017  Does Patient Have a Medical Advance Directive? No No No  Would patient like information on creating a medical advance directive? No - Patient declined - -    Current Medications (verified) Outpatient Encounter Medications as of 11/16/2019  Medication Sig  . amLODipine-benazepril (LOTREL) 5-20 MG capsule Take 1 capsule by mouth daily.  Marland Kitchen atorvastatin (LIPITOR) 40 MG tablet Take 1 tablet by mouth once daily  . Cholecalciferol (VITAMIN D3) 50000 units TABS Take by mouth once a week.  . Flaxseed, Linseed, (FLAXSEED OIL PO) Take by mouth.  . Ginger, Zingiber officinalis, (GINGER ROOT PO) Take by mouth.  . metoprolol succinate (TOPROL-XL) 25 MG 24 hr tablet Take 1 tablet by mouth once daily  . Multiple Vitamins-Minerals (MULTIVITAMIN ADULT PO)  Take by mouth daily.  . Omega-3 Fatty Acids (OMEGA-3 CF PO) Take by mouth.  . pantoprazole (PROTONIX) 40 MG tablet Take 1 tablet (40 mg total) by mouth daily.  Marland Kitchen triamterene-hydrochlorothiazide (MAXZIDE-25) 37.5-25 MG tablet Take 1 tablet by mouth once daily  . fluticasone (FLONASE) 50 MCG/ACT nasal spray Place 2 sprays into both nostrils daily. (Patient not taking: Reported on 11/16/2019)  . [DISCONTINUED] ciprofloxacin (CIPRO) 500 MG tablet Take 1 tablet (500 mg total) by mouth 2 (two) times daily.  . [DISCONTINUED] tiZANidine (ZANAFLEX) 4 MG tablet Take 1 tablet (4 mg total) by mouth every 8 (eight) hours as needed for muscle spasms.   No facility-administered encounter medications on file as of 11/16/2019.    Allergies (verified) Penicillins   History: Past Medical History:  Diagnosis Date  . Allergy   . Cataract    "beginnings of"  . GERD (gastroesophageal reflux disease)   . Hyperlipidemia   . Hypertension   . Thyroid disease    2 benign nodules noted- check yearly  . Vitamin D deficiency    Past Surgical History:  Procedure Laterality Date  . ABDOMINAL HYSTERECTOMY    . SIGMOIDOSCOPY    . WISDOM TOOTH EXTRACTION     Family History  Problem Relation Age of Onset  . Stroke Mother   . Hypertension Mother   . Cancer Father   . Lung cancer Father   . Stroke Brother   . Hypertension Maternal Grandmother   . Hypertension Maternal Grandfather   . Alcohol abuse Brother   . Hypertension Brother   . Stroke  Brother   . Cancer Sister   . Thyroid disease Neg Hx   . Colon cancer Neg Hx   . Esophageal cancer Neg Hx   . Rectal cancer Neg Hx   . Stomach cancer Neg Hx    Social History   Socioeconomic History  . Marital status: Divorced    Spouse name: Not on file  . Number of children: Not on file  . Years of education: Not on file  . Highest education level: Not on file  Occupational History  . Not on file  Tobacco Use  . Smoking status: Never Smoker  . Smokeless  tobacco: Never Used  Vaping Use  . Vaping Use: Never used  Substance and Sexual Activity  . Alcohol use: Yes    Comment: Occ  . Drug use: No  . Sexual activity: Not on file  Other Topics Concern  . Not on file  Social History Narrative  . Not on file   Social Determinants of Health   Financial Resource Strain: Low Risk   . Difficulty of Paying Living Expenses: Not hard at all  Food Insecurity: No Food Insecurity  . Worried About Programme researcher, broadcasting/film/video in the Last Year: Never true  . Ran Out of Food in the Last Year: Never true  Transportation Needs: No Transportation Needs  . Lack of Transportation (Medical): No  . Lack of Transportation (Non-Medical): No  Physical Activity: Insufficiently Active  . Days of Exercise per Week: 5 days  . Minutes of Exercise per Session: 20 min  Stress: No Stress Concern Present  . Feeling of Stress : Not at all  Social Connections: Socially Integrated  . Frequency of Communication with Friends and Family: More than three times a week  . Frequency of Social Gatherings with Friends and Family: More than three times a week  . Attends Religious Services: More than 4 times per year  . Active Member of Clubs or Organizations: Yes  . Attends Banker Meetings: More than 4 times per year  . Marital Status: Living with partner    Tobacco Counseling Counseling given: Not Answered   Clinical Intake:  Pre-visit preparation completed: Yes  Pain : No/denies pain     Nutritional Risks: None Diabetes: No  How often do you need to have someone help you when you read instructions, pamphlets, or other written materials from your doctor or pharmacy?: 1 - Never What is the last grade level you completed in school?: BA degree  Diabetic?No  Interpreter Needed?: No  Information entered by :: SCrews,LPN   Activities of Daily Living In your present state of health, do you have any difficulty performing the following activities: 11/16/2019    Hearing? N  Vision? N  Difficulty concentrating or making decisions? N  Walking or climbing stairs? N  Dressing or bathing? N  Doing errands, shopping? N  Preparing Food and eating ? N  Using the Toilet? N  In the past six months, have you accidently leaked urine? N  Do you have problems with loss of bowel control? N  Managing your Medications? N  Managing your Finances? N  Housekeeping or managing your Housekeeping? N  Some recent data might be hidden    Patient Care Team: [place], Betty G, MD as PCP - General (Family Medicine)  Indicate any recent Medical Services you may have received from other than [facility] providers in the past year (date may be approximate).     Assessment:   This is  a routine wellness examination for [place].  Hearing/Vision screen  Hearing Screening   125Hz  250Hz  500Hz  1000Hz  2000Hz  3000Hz  4000Hz  6000Hz  8000Hz   Right ear:           Left ear:           Vision Screening Comments: Gets eyes checked annually    Dietary issues and exercise activities discussed: Current Exercise Habits: Home exercise routine, Type of exercise: stretching, Time (Minutes): 25, Frequency (Times/Week): 5, Weekly Exercise (Minutes/Week): 125, Exercise limited by: None identified  Goals    . DIET - REDUCE SUGAR INTAKE     Wants to reduce sugar in diet     . Patient Stated     Plan for your personal time !    . Reduce caffeine intake     Wants to reduce cups of coffee     . Weight (lb) < 160 lb (72.6 kg)     Keep up the good work with your personal trainer and weight loss       Depression Screen PHQ 2/9 Scores 11/16/2019 10/17/2018 10/12/2017  PHQ - 2 Score 0 0 0  PHQ- 9 Score 0 - -    Fall Risk Fall Risk  11/16/2019 10/17/2018 10/12/2017  Falls in the past year? 0 0 No  Number falls in past yr: 0 0 -  Injury with Fall? 0 0 -  Risk for fall due to : Medication side effect - -  Follow up Falls evaluation completed;Falls prevention discussed Education provided -    Any  stairs in or around the home? No  If so, are there any without handrails? No  Home free of loose throw rugs in walkways, pet beds, electrical cords, etc? Yes  Adequate lighting in your home to reduce risk of falls? Yes   ASSISTIVE DEVICES UTILIZED TO PREVENT FALLS:  Life alert? No  Use of a cane, walker or w/c? No  Grab bars in the bathroom? No  Shower chair or bench in shower? No  Elevated toilet seat or a handicapped toilet? No     Cognitive Function: Cognition within normal limits based upon direct observation        Immunizations Immunization History  Administered Date(s) Administered  . Fluad Quad(high Dose 65+) 01/31/2019  . Influenza, High Dose Seasonal PF 12/24/2016, 01/12/2018  . PFIZER SARS-COV-2 Vaccination 05/19/2019, 06/10/2019  . Pneumococcal Conjugate-13 04/06/2015  . Pneumococcal Polysaccharide-23 10/12/2017    TDAP status: Due, Education has been provided regarding the importance of this vaccine. Advised may receive this vaccine at local pharmacy or Health Dept. Aware to provide a copy of the vaccination record if obtained from local pharmacy or Health Dept. Verbalized acceptance and understanding. Flu Vaccine status: Up to date Pneumococcal vaccine status: Up to date Covid-19 vaccine status: Completed vaccines  Qualifies for Shingles Vaccine? Yes   Zostavax completed No   Shingrix Completed?: No.    Education has been provided regarding the importance of this vaccine. Patient has been advised to call insurance company to determine out of pocket expense if they have not yet received this vaccine. Advised may also receive vaccine at local pharmacy or Health Dept. Verbalized acceptance and understanding.  Screening Tests Health Maintenance  Topic Date Due  . TETANUS/TDAP  Never done  . INFLUENZA VACCINE  11/04/2019  . MAMMOGRAM  04/09/2021  . COLONOSCOPY  04/25/2028  . DEXA SCAN  Completed  . COVID-19 Vaccine  Completed  . Hepatitis C Screening   Completed  . PNA vac  Low Risk Adult  Completed    Health Maintenance  Health Maintenance Due  Topic Date Due  . TETANUS/TDAP  Never done  . INFLUENZA VACCINE  11/04/2019    Colorectal cancer screening: Completed 04/25/2018. Repeat every 10 years Mammogram status: Completed 04/10/2019. Repeat every year Bone Density status: Completed 02/23/2018. Results reflect: Bone density results: NORMAL. Repeat every 0 years.  Lung Cancer Screening: (Low Dose CT Chest recommended if Age 81-80 years, 30 pack-year currently smoking OR have quit w/in 15years.) does not qualify.   Lung Cancer Screening Referral: N/A  Additional Screening:  Hepatitis C Screening: does qualify; Completed 10/04/2017  Vision Screening: Recommended annual ophthalmology exams for early detection of glaucoma and other disorders of the eye. Is the patient up to date with their annual eye exam?  Yes  Who is the provider or what is the name of the office in which the patient attends annual eye exams? [facility]  If pt is not established with a provider, would they like to be referred to a provider to establish care? No .   Dental Screening: Recommended annual dental exams for proper oral hygiene  Community Resource Referral / Chronic Care Management: CRR required this visit?  No   CCM required this visit?  No      Plan:     I have personally reviewed and noted the following in the patient's chart:   . Medical and social history . Use of alcohol, tobacco or illicit drugs  . Current medications and supplements . Functional ability and status . Nutritional status . Physical activity . Advanced directives . List of other physicians . Hospitalizations, surgeries, and ER visits in previous 12 months . Vitals . Screenings to include cognitive, depression, and falls . Referrals and appointments  In addition, I have reviewed and discussed with patient certain preventive protocols, quality metrics, and best  practice recommendations. A written personalized care plan for preventive services as well as general preventive health recommendations were provided to patient.     Theodora BlowShannon Razan Siler, LPN   9/60/45408/13/2021   Nurse Notes: None

## 2019-11-21 ENCOUNTER — Other Ambulatory Visit: Payer: Self-pay | Admitting: Family Medicine

## 2019-11-21 DIAGNOSIS — I10 Essential (primary) hypertension: Secondary | ICD-10-CM

## 2020-01-02 ENCOUNTER — Ambulatory Visit (HOSPITAL_COMMUNITY): Admission: RE | Admit: 2020-01-02 | Payer: Medicare Other | Source: Ambulatory Visit

## 2020-01-02 ENCOUNTER — Encounter: Payer: Self-pay | Admitting: Family Medicine

## 2020-01-02 ENCOUNTER — Other Ambulatory Visit: Payer: Self-pay

## 2020-01-02 ENCOUNTER — Ambulatory Visit (INDEPENDENT_AMBULATORY_CARE_PROVIDER_SITE_OTHER): Payer: Medicare Other | Admitting: Family Medicine

## 2020-01-02 ENCOUNTER — Ambulatory Visit (INDEPENDENT_AMBULATORY_CARE_PROVIDER_SITE_OTHER): Payer: Medicare Other

## 2020-01-02 VITALS — BP 128/80 | HR 61 | Resp 16 | Ht 62.0 in | Wt 163.2 lb

## 2020-01-02 DIAGNOSIS — K219 Gastro-esophageal reflux disease without esophagitis: Secondary | ICD-10-CM

## 2020-01-02 DIAGNOSIS — M25552 Pain in left hip: Secondary | ICD-10-CM

## 2020-01-02 DIAGNOSIS — R7989 Other specified abnormal findings of blood chemistry: Secondary | ICD-10-CM | POA: Diagnosis not present

## 2020-01-02 DIAGNOSIS — Z23 Encounter for immunization: Secondary | ICD-10-CM

## 2020-01-02 DIAGNOSIS — R0789 Other chest pain: Secondary | ICD-10-CM | POA: Diagnosis not present

## 2020-01-02 DIAGNOSIS — I10 Essential (primary) hypertension: Secondary | ICD-10-CM | POA: Diagnosis not present

## 2020-01-02 DIAGNOSIS — R0602 Shortness of breath: Secondary | ICD-10-CM

## 2020-01-02 LAB — D-DIMER, QUANTITATIVE: D-Dimer, Quant: 0.73 mcg/mL FEU — ABNORMAL HIGH (ref <0.50)

## 2020-01-02 NOTE — Progress Notes (Signed)
HPI: [name] is a 69 y.o. female, who is here today for 6 months follow up.   She was last seen on 06/25/19. Since her last visit she has been in the ER, 12/22/19,becasue headache. She was also having chest tightness, feeling drained, and worsening SOB. CP was radiated to left shoulder and neck.  EKG :Non specific T wave changes.  CXR: Negative  TSH 1.4 HGA1C 6.2. D-Dimer elevated at 570 ng/ml (normal 190-500). Troponin 3 CBC normal. COVID 19 test negative.  CK MB/Total (Sendout) Specimen:  Blood  Ref Range & Units 11 d ago  CREATININE KINASE, TOTAL 32 - 182 U/L 262High      CREATININE KINASE (CK),MB 0.0 - 5.3 ng/mL 3.9     Comprehensive Metabolic Panel Specimen:  Blood  Ref Range & Units 11 d ago Comments  Sodium 135 - 146 MMOL/L 133Low        Potassium 3.5 - 5.3 MMOL/L 3.5  NO VISIBLE HEMOLYSIS      Chloride 98 - 110 MMOL/L 94Low        CO2 23 - 30 MMOL/L 30        BUN 8 - 24 MG/DL 13        Glucose 70 - 99 MG/DL 88  Patients taking eltrombopag at doses >/= 100 mg daily may show falsely elevated values of 10% or greater.      Creatinine 0.50 - 1.50 MG/DL 1.610.94        Calcium 8.5 - 10.5 MG/DL 9.8        Total Protein 6.0 - 8.3 G/DL 7.6  Patients taking eltrombopag at doses >/= 100 mg daily may show falsely elevated values of 10% or greater.      Albumin  3.5 - 5.0 G/DL 4.6        Total Bilirubin 0.1 - 1.2 MG/DL 0.9UEAV1.8High  Patients taking eltrombopag at doses >/= 100 mg daily may show falsely elevated values of 10% or greater.      Alkaline Phosphatase 25 - 125 IU/L or U/L 95        AST (SGOT) 5 - 40 IU/L or U/L 21        ALT (SGPT) 5 - 50 IU/L or U/L 21        Anion Gap 4 - 14 MMOL/L 9        Est. GFR African American >=60 ML/MIN/1.73 M*2  71      SOB has improved about 80-90% but not resolved. She has had CP in the past, intermittent,upper mid chest. Cardiologist evaluation in 2019, work up otherwise negative.  No recent surgery  or hx of trauma. She travels to [place] back and fourth, last trip in 10/2019,she flies with a stop, 2 hours at the time. She has not noted calves pain,edema,or erythema.  3-4 weeks ago she has one episode of dizziness when she was sitting on her couch and she thinks it was because she ate "too much sugar."She has not had more dizziness.  GERD:+ Acid reflux, last time she felt it was yesterday, sensation is hard to describe,? Burning, but Prilosec helps. This feels different that the CP she was having when she went to the ER.Sometiems when lying down. Negative for dysphagia,abdominal pain,N/V,or melena. She is having daily bowel movements in am after eating breakfast.  HTN: She is on Triamterene-HCTZ 37.5-25 mg daily, Amlodipine-benazepril 5-20 mg daily,and Metoprolol Succinate 25 mg daily. She has not noted visual changes, orthopnea,PND,focal neurologic deficit,or edema.  BP  readings at home on lower normal range, after taking sinus medication BP was high.  She is having some skin pruritus after taking Triamterene-HCTZ, she takes med at night. No skin rash.  Lab Results  Component Value Date   CREATININE 1.00 06/25/2019   BUN 10 06/25/2019   NA 135 06/25/2019   K 3.6 06/25/2019   CL 95 (L) 06/25/2019   CO2 31 06/25/2019   Lower back pain,intermittent for a while. Pain is not radiated but some times she feels LLE soreness. It seems to be getting worse. LLE pain seems to be more noticeable at night when in bed. Negative for numbness,tongling,saddle anesthesia,or bowel/bladder dysfunction. Icy hot on back helps.  When sitting on toilet she has left groin pain x 3 days. This is a new problems. It lasts a minute or so. Pian is not related with heavy lifting and she has not noted bulged area.  Review of Systems  Constitutional: Positive for fatigue. Negative for activity change, appetite change and fever.  HENT: Negative for mouth sores, nosebleeds and sore throat.   Eyes:  Negative for redness and visual disturbance.  Respiratory: Negative for cough and wheezing.   Gastrointestinal:       Negative for changes in bowel habits.  Endocrine: Negative for cold intolerance and heat intolerance.  Genitourinary: Negative for decreased urine volume, dysuria and hematuria.  Neurological: Negative for syncope, facial asymmetry and weakness.  Psychiatric/Behavioral: Positive for sleep disturbance. Negative for confusion. The patient is nervous/anxious.   Rest of ROS, see pertinent positives sand negatives in HPI  Current Outpatient Medications on File Prior to Visit  Medication Sig Dispense Refill  . amLODipine-benazepril (LOTREL) 5-20 MG capsule Take 1 capsule by mouth daily. 30 capsule 2  . atorvastatin (LIPITOR) 40 MG tablet Take 1 tablet by mouth once daily 90 tablet 0  . Cholecalciferol (VITAMIN D3) 50000 units TABS Take by mouth once a week.    . metoprolol succinate (TOPROL-XL) 25 MG 24 hr tablet Take 1 tablet by mouth once daily 90 tablet 1  . Multiple Vitamins-Minerals (MULTIVITAMIN ADULT PO) Take by mouth daily.    . Omega-3 Fatty Acids (OMEGA-3 CF PO) Take by mouth.    . pantoprazole (PROTONIX) 40 MG tablet Take 1 tablet (40 mg total) by mouth daily. 30 tablet 3   No current facility-administered medications on file prior to visit.     Past Medical History:  Diagnosis Date  . Allergy   . Cataract    "beginnings of"  . GERD (gastroesophageal reflux disease)   . Hyperlipidemia   . Hypertension   . Thyroid disease    2 benign nodules noted- check yearly  . Vitamin D deficiency    Allergies  Allergen Reactions  . Penicillins Hives    Social History   Socioeconomic History  . Marital status: Divorced    Spouse name: Not on file  . Number of children: Not on file  . Years of education: Not on file  . Highest education level: Not on file  Occupational History  . Not on file  Tobacco Use  . Smoking status: Never Smoker  . Smokeless tobacco:  Never Used  Vaping Use  . Vaping Use: Never used  Substance and Sexual Activity  . Alcohol use: Yes    Comment: Occ  . Drug use: No  . Sexual activity: Not on file  Other Topics Concern  . Not on file  Social History Narrative  . Not on file   Social  Determinants of Health   Financial Resource Strain: Low Risk   . Difficulty of Paying Living Expenses: Not hard at all  Food Insecurity: No Food Insecurity  . Worried About Programme researcher, broadcasting/film/video in the Last Year: Never true  . Ran Out of Food in the Last Year: Never true  Transportation Needs: No Transportation Needs  . Lack of Transportation (Medical): No  . Lack of Transportation (Non-Medical): No  Physical Activity: Insufficiently Active  . Days of Exercise per Week: 5 days  . Minutes of Exercise per Session: 20 min  Stress: No Stress Concern Present  . Feeling of Stress : Not at all  Social Connections: Socially Integrated  . Frequency of Communication with Friends and Family: More than three times a week  . Frequency of Social Gatherings with Friends and Family: More than three times a week  . Attends Religious Services: More than 4 times per year  . Active Member of Clubs or Organizations: Yes  . Attends Banker Meetings: More than 4 times per year  . Marital Status: Living with partner    Vitals:   01/02/20 0818  BP: 128/80  Pulse: 61  Resp: 16  SpO2: 98%   Body mass index is 29.86 kg/m.  Physical Exam Vitals and nursing note reviewed.  Constitutional:      General: She is not in acute distress.    Appearance: She is well-developed.  HENT:     Head: Normocephalic and atraumatic.     Mouth/Throat:     Mouth: Mucous membranes are moist.     Pharynx: Oropharynx is clear.  Eyes:     Conjunctiva/sclera: Conjunctivae normal.     Pupils: Pupils are equal, round, and reactive to light.  Cardiovascular:     Rate and Rhythm: Normal rate and regular rhythm.     Pulses:          Dorsalis pedis pulses  are 2+ on the right side and 2+ on the left side.     Heart sounds: No murmur heard.      Comments: No calves pain with dorsiflexion of feet. No tenderness with calf palpation,bilateral. Pulmonary:     Effort: Pulmonary effort is normal. No respiratory distress.     Breath sounds: Normal breath sounds.  Abdominal:     Palpations: Abdomen is soft. There is no hepatomegaly or mass.     Tenderness: There is no abdominal tenderness.     Hernia: There is no hernia in the left inguinal area.  Musculoskeletal:       Back:     Comments: Pain elicited with hip flexion.  Lymphadenopathy:     Cervical: No cervical adenopathy.  Skin:    General: Skin is warm.     Findings: No erythema or rash.  Neurological:     General: No focal deficit present.     Mental Status: She is alert and oriented to person, place, and time.     Cranial Nerves: No cranial nerve deficit.     Gait: Gait normal.  Psychiatric:        Mood and Affect: Affect normal. Mood is anxious.     Comments: Well groomed, good eye contact.   ASSESSMENT AND PLAN:  [name] was seen today for 6 months follow-up.  Orders Placed This Encounter  Procedures  . DG Hip Unilat W OR W/O Pelvis 2-3 Views Left  . DG HIP UNILAT WITH PELVIS 2-3 VIEWS LEFT  . CT Angio  Chest W/Cm &/Or Wo Cm  . Flu Vaccine QUAD High Dose(Fluad)  . D-dimer, Quantitative   Lab Results  Component Value Date   DDIMER 0.73 (H) 01/02/2020    Hip pain, acute, left We discussed possible etiologies. ? OA, radicular pain. Hip X ray ordered. Monitor for now symptoms. Lumbar MRI will be considered of LLE and hip pain continue.  Elevated d-dimer Mildly elevated. Symptoms have improved. D-Dimer ordered today, if worse chest CT will be recommended. Instructed about warning signs.  Hypertension, essential, benign BP adequately controlled. Because triamterene-HCTZ  is causing skin pruritus, recommend stopping it. Continue Amlodipine-Benazepril  5-20 mg and Metoprolol succinate 25 mg daily. Continue monitoring BP.  Need for influenza vaccination -     Flu Vaccine QUAD High Dose(Fluad)  Other chest pain Possible causes discussed. Hx does not suggest cardiac etiology but if recurrent and work up is negative,cardio evaluation will be considered. Instructed about warning signs.  SOB (shortness of breath) No associated cough or wheezing. With elevated D dimer, PE needs to be consider, chest CT will be arranged if D dimer is not improved. ? Deconditioning. Instructed about warning signs.  Gastroesophageal reflux disease, unspecified whether esophagitis present Could be acontributing factor for some of her complaint. For now continue Protonix 20 mg daily and GERD precautions.   Spent 44 minutes with pt.  During this time history was obtained and documented, examination was performed,labs/imaging reviewed, and assessment/plan discussed. She voices understanding and agrees with plan  Return in about 4 weeks (around 01/30/2020) for HTN.   Latasha Young G. [place], MD  [facility] [place] office.  A few things to remember from today's visit:   Right hip pain - Plan: DG Hip Unilat W OR W/O Pelvis 2-3 Views Left  Elevated d-dimer - Plan: D-dimer, Quantitative  Hold on triamterene-hydrochlorothiazide and monitor for changes in skin itching. Avoid cold meds. Monitor blood pressure.   If shortness of breath or chest discomfort get worse please call [phone] or go to the ER.  If you need refills please call your pharmacy. Do not use My Chart to request refills or for acute issues that need immediate attention.    Please be sure medication list is accurate. If a new problem present, please set up appointment sooner than planned today.

## 2020-01-02 NOTE — Patient Instructions (Addendum)
A few things to remember from today's visit:   Right hip pain - Plan: DG Hip Unilat W OR W/O Pelvis 2-3 Views Left  Elevated d-dimer - Plan: D-dimer, Quantitative  Hold on triamterene-hydrochlorothiazide and monitor for changes in skin itching. Avoid cold meds. Monitor blood pressure.   If shortness of breath or chest discomfort get worse please call [phone] or go to the ER.  If you need refills please call your pharmacy. Do not use My Chart to request refills or for acute issues that need immediate attention.    Please be sure medication list is accurate. If a new problem present, please set up appointment sooner than planned today.

## 2020-01-05 ENCOUNTER — Encounter: Payer: Self-pay | Admitting: Family Medicine

## 2020-01-09 ENCOUNTER — Encounter: Payer: Self-pay | Admitting: Family Medicine

## 2020-01-15 ENCOUNTER — Other Ambulatory Visit: Payer: Self-pay | Admitting: Family Medicine

## 2020-01-15 DIAGNOSIS — R109 Unspecified abdominal pain: Secondary | ICD-10-CM

## 2020-01-16 ENCOUNTER — Ambulatory Visit
Admission: RE | Admit: 2020-01-16 | Discharge: 2020-01-16 | Disposition: A | Discharge status: Home / Self Care | Payer: Medicare Other | Source: Ambulatory Visit | Attending: Family Medicine | Admitting: Family Medicine

## 2020-01-16 DIAGNOSIS — I898 Other specified noninfective disorders of lymphatic vessels and lymph nodes: Secondary | ICD-10-CM | POA: Diagnosis not present

## 2020-01-16 DIAGNOSIS — R7989 Other specified abnormal findings of blood chemistry: Secondary | ICD-10-CM

## 2020-01-16 DIAGNOSIS — J398 Other specified diseases of upper respiratory tract: Secondary | ICD-10-CM | POA: Diagnosis not present

## 2020-01-16 DIAGNOSIS — R0602 Shortness of breath: Secondary | ICD-10-CM

## 2020-01-16 DIAGNOSIS — R0789 Other chest pain: Secondary | ICD-10-CM

## 2020-01-16 DIAGNOSIS — I700 Atherosclerosis of aorta: Secondary | ICD-10-CM | POA: Diagnosis not present

## 2020-01-16 MED ORDER — IOPAMIDOL (ISOVUE-370) INJECTION 76%
75.0000 mL | Freq: Once | INTRAVENOUS | Status: AC | PRN
  Administered 2020-01-16: 75 mL via INTRAVENOUS

## 2020-01-17 ENCOUNTER — Encounter: Payer: Self-pay | Admitting: Family Medicine

## 2020-01-17 DIAGNOSIS — I700 Atherosclerosis of aorta: Secondary | ICD-10-CM | POA: Insufficient documentation

## 2020-01-21 DIAGNOSIS — K219 Gastro-esophageal reflux disease without esophagitis: Secondary | ICD-10-CM | POA: Diagnosis not present

## 2020-01-21 DIAGNOSIS — D1803 Hemangioma of intra-abdominal structures: Secondary | ICD-10-CM | POA: Diagnosis not present

## 2020-01-21 DIAGNOSIS — R140 Abdominal distension (gaseous): Secondary | ICD-10-CM | POA: Diagnosis not present

## 2020-01-29 ENCOUNTER — Ambulatory Visit: Payer: Medicare Other | Admitting: Endocrinology

## 2020-03-10 ENCOUNTER — Ambulatory Visit: Payer: Medicare Other | Admitting: Endocrinology

## 2020-03-10 ENCOUNTER — Encounter: Payer: Self-pay | Admitting: Endocrinology

## 2020-03-10 ENCOUNTER — Other Ambulatory Visit: Payer: Self-pay

## 2020-03-10 VITALS — BP 142/100 | HR 90 | Ht 61.5 in | Wt 160.0 lb

## 2020-03-10 DIAGNOSIS — E042 Nontoxic multinodular goiter: Secondary | ICD-10-CM | POA: Diagnosis not present

## 2020-03-10 LAB — T4, FREE: Free T4: 0.62 ng/dL (ref 0.60–1.60)

## 2020-03-10 LAB — TSH: TSH: 0.97 u[IU]/mL (ref 0.35–4.50)

## 2020-03-10 NOTE — Patient Instructions (Addendum)
Your blood pressure is high today.  Please see your primary care provider soon, to have it rechecked. Thyroid blood tests are requested for you today.  We'll let you know about the results.  Please come back for a follow-up appointment in 1 year.

## 2020-03-10 NOTE — Progress Notes (Signed)
Subjective:    Patient ID: Latasha Young, female    DOB: June 12, 1950, 69 y.o.   MRN: [id number]  HPI Pt returns for f/u of multinodular thyroid (dx'ed 2018; bx then was: R: cat 2, and L: cat 1; she is euthyroid off rx; f/u US is 2020 was unchanged; she is euthyroid off rx).  pt states she feels well in general.  Specifically, she denies neck swelling.   Past Medical History:  Diagnosis Date  . Allergy   . Cataract    "beginnings of"  . GERD (gastroesophageal reflux disease)   . Hyperlipidemia   . Hypertension   . Thyroid disease    2 benign nodules noted- check yearly  . Vitamin D deficiency     Past Surgical History:  Procedure Laterality Date  . ABDOMINAL HYSTERECTOMY    . SIGMOIDOSCOPY    . WISDOM TOOTH EXTRACTION      Social History   Socioeconomic History  . Marital status: Divorced    Spouse name: Not on file  . Number of children: Not on file  . Years of education: Not on file  . Highest education level: Not on file  Occupational History  . Not on file  Tobacco Use  . Smoking status: Never Smoker  . Smokeless tobacco: Never Used  Vaping Use  . Vaping Use: Never used  Substance and Sexual Activity  . Alcohol use: Yes    Comment: Occ  . Drug use: No  . Sexual activity: Not on file  Other Topics Concern  . Not on file  Social History Narrative  . Not on file   Social Determinants of Health   Financial Resource Strain: Low Risk   . Difficulty of Paying Living Expenses: Not hard at all  Food Insecurity: No Food Insecurity  . Worried About Programme researcher, broadcasting/film/video in the Last Year: Never true  . Ran Out of Food in the Last Year: Never true  Transportation Needs: No Transportation Needs  . Lack of Transportation (Medical): No  . Lack of Transportation (Non-Medical): No  Physical Activity: Insufficiently Active  . Days of Exercise per Week: 5 days  . Minutes of Exercise per Session: 20 min  Stress: No Stress Concern Present  . Feeling of Stress : Not at  all  Social Connections: Socially Integrated  . Frequency of Communication with Friends and Family: More than three times a week  . Frequency of Social Gatherings with Friends and Family: More than three times a week  . Attends Religious Services: More than 4 times per year  . Active Member of Clubs or Organizations: Yes  . Attends Banker Meetings: More than 4 times per year  . Marital Status: Living with partner  Intimate Partner Violence: Not At Risk  . Fear of Current or Ex-Partner: No  . Emotionally Abused: No  . Physically Abused: No  . Sexually Abused: No    Current Outpatient Medications on File Prior to Visit  Medication Sig Dispense Refill  . amLODipine-benazepril (LOTREL) 5-20 MG capsule Take 1 capsule by mouth daily. 30 capsule 2  . atorvastatin (LIPITOR) 40 MG tablet Take 1 tablet by mouth once daily 90 tablet 0  . Cholecalciferol (VITAMIN D3) 50000 units TABS Take by mouth once a week.    . metoprolol succinate (TOPROL-XL) 25 MG 24 hr tablet Take 1 tablet by mouth once daily 90 tablet 1  . Multiple Vitamins-Minerals (MULTIVITAMIN ADULT PO) Take by mouth daily.    Marland Kitchen  Omega-3 Fatty Acids (OMEGA-3 CF PO) Take by mouth.    . pantoprazole (PROTONIX) 40 MG tablet Take 1 tablet (40 mg total) by mouth daily. 30 tablet 3   No current facility-administered medications on file prior to visit.    Allergies  Allergen Reactions  . Penicillins Hives    Family History  Problem Relation Age of Onset  . Stroke Mother   . Hypertension Mother   . Cancer Father   . Lung cancer Father   . Stroke Brother   . Hypertension Maternal Grandmother   . Hypertension Maternal Grandfather   . Alcohol abuse Brother   . Hypertension Brother   . Stroke Brother   . Cancer Sister   . Goiter Sister   . Colon cancer Neg Hx   . Esophageal cancer Neg Hx   . Rectal cancer Neg Hx   . Stomach cancer Neg Hx     BP (!) 142/100   Pulse 90   Ht 5' 1.5" (1.562 m)   Wt 160 lb (72.6 kg)    SpO2 96%   BMI 29.74 kg/m    Review of Systems     Objective:   Physical Exam VITAL SIGNS:  See vs page.   GENERAL: no distress.   NECK: right thyroid swelling is again noted, but I can't tell details.    Lab Results  Component Value Date   TSH 0.97 03/10/2020       Assessment & Plan:  MNG, clinically stable.  She declines to recheck [place] this year.   Patient Instructions  Your blood pressure is high today.  Please see your primary care provider soon, to have it rechecked. Thyroid blood tests are requested for you today.  We'll let you know about the results.  Please come back for a follow-up appointment in 1 year.

## 2020-03-13 ENCOUNTER — Other Ambulatory Visit: Payer: Self-pay | Admitting: Family Medicine

## 2020-03-13 DIAGNOSIS — Z1231 Encounter for screening mammogram for malignant neoplasm of breast: Secondary | ICD-10-CM

## 2020-03-24 DIAGNOSIS — K5900 Constipation, unspecified: Secondary | ICD-10-CM | POA: Diagnosis not present

## 2020-03-24 DIAGNOSIS — K219 Gastro-esophageal reflux disease without esophagitis: Secondary | ICD-10-CM | POA: Diagnosis not present

## 2020-03-24 DIAGNOSIS — R140 Abdominal distension (gaseous): Secondary | ICD-10-CM | POA: Diagnosis not present

## 2020-04-24 ENCOUNTER — Other Ambulatory Visit: Payer: Self-pay

## 2020-04-24 ENCOUNTER — Ambulatory Visit
Admission: RE | Admit: 2020-04-24 | Discharge: 2020-04-24 | Disposition: A | Discharge status: Home / Self Care | Payer: Medicare Other | Source: Ambulatory Visit | Attending: Family Medicine | Admitting: Family Medicine

## 2020-04-24 DIAGNOSIS — Z1231 Encounter for screening mammogram for malignant neoplasm of breast: Secondary | ICD-10-CM

## 2020-04-25 ENCOUNTER — Other Ambulatory Visit: Payer: Self-pay | Admitting: Family Medicine

## 2020-04-25 DIAGNOSIS — R928 Other abnormal and inconclusive findings on diagnostic imaging of breast: Secondary | ICD-10-CM

## 2020-05-08 ENCOUNTER — Ambulatory Visit: Payer: Medicare Other

## 2020-05-08 ENCOUNTER — Other Ambulatory Visit: Payer: Self-pay

## 2020-05-08 ENCOUNTER — Ambulatory Visit
Admission: RE | Admit: 2020-05-08 | Discharge: 2020-05-08 | Disposition: A | Discharge status: Home / Self Care | Payer: Medicare Other | Source: Ambulatory Visit | Attending: Family Medicine | Admitting: Family Medicine

## 2020-05-08 DIAGNOSIS — R928 Other abnormal and inconclusive findings on diagnostic imaging of breast: Secondary | ICD-10-CM

## 2020-05-08 DIAGNOSIS — N6489 Other specified disorders of breast: Secondary | ICD-10-CM | POA: Diagnosis not present

## 2020-06-03 NOTE — Progress Notes (Signed)
Cardiology Office Note   Date:  06/04/2020   ID:  Tauna Macfarlane [place], [place] [birth year], MRN [id number]  PCP:  [place], Betty G, MD    No chief complaint on file.    Wt Readings from Last 3 Encounters:  06/04/20 164 lb 12.8 oz (74.8 kg)  03/10/20 160 lb (72.6 kg)  01/02/20 163 lb 4 oz (74 kg)       History of Present Illness: Latasha Young is a 70 y.o. female   who is being seen today for the evaluation of chest pain at the request of [place], Betty G, MD.  In January 2020, I saw her for intermittent chest pain in November and December 2019.  SHe had significant abdominal pain at that time.  She had left hand numbness and back pain as well.    Family h/o CAD: Mother had stomach pains, but apparently died of an "aneurysm."  No early CAD.   Normal stress test a few years ago with Dr. Jacinto Halim.   CTA in 2020 sowed: "Coronary Arteries: Right dominant with no anomalies  LM: Normal  LAD: Normal  D1: Normal  D2: Normal  D3: Normal  Circumflex: Normal  OM1: Normal  OM2: Normal  RCA: Normal  PDA: Normal  PLA: Normal  IMPRESSION: 1.  Normal right dominant coronary arteries  2.  Calcium score 0  3.  Normal aortic root diameter 3.7 cm"  OVer the past few weeks, she had some dizziness after getting off of the treadmill.    She has also had some chest pain radiating to her arm, chest heavines when she was sitting down.  It improves with walking around.  She decided to not go to the ER because sx improved with walking. She also has some left leg pain.  She has some fatigue now with less activity.      BP at home has been fluctuating.     Past Medical History:  Diagnosis Date  . Allergy   . Cataract    "beginnings of"  . GERD (gastroesophageal reflux disease)   . Hyperlipidemia   . Hypertension   . Thyroid disease    2 benign nodules noted- check yearly  . Vitamin D deficiency     Past Surgical History:  Procedure Laterality Date  .  ABDOMINAL HYSTERECTOMY    . SIGMOIDOSCOPY    . WISDOM TOOTH EXTRACTION       Current Outpatient Medications  Medication Sig Dispense Refill  . amLODipine-benazepril (LOTREL) 5-20 MG capsule Take 1 capsule by mouth daily. 30 capsule 2  . atorvastatin (LIPITOR) 40 MG tablet Take 1 tablet by mouth once daily 90 tablet 0  . Cholecalciferol (VITAMIN D3) 50000 units TABS Take by mouth once a week.    . metoprolol succinate (TOPROL-XL) 25 MG 24 hr tablet Take 1 tablet by mouth once daily 90 tablet 1  . Multiple Vitamins-Minerals (MULTIVITAMIN ADULT PO) Take by mouth daily.    . Omega-3 Fatty Acids (OMEGA-3 CF PO) Take by mouth.    . pantoprazole (PROTONIX) 40 MG tablet Take 1 tablet (40 mg total) by mouth daily. 30 tablet 3  . triamterene-hydrochlorothiazide (DYAZIDE) 37.5-25 MG capsule Take 1 capsule by mouth daily.     No current facility-administered medications for this visit.    Allergies:   Penicillins    Social History:  The patient  reports that she has never smoked. She has never used smokeless tobacco. She reports current alcohol use. She reports that she  does not use drugs.   Family History:  The patient's family history includes Alcohol abuse in her brother; Cancer in her father and sister; Goiter in her sister; Hypertension in her brother, maternal grandfather, maternal grandmother, and mother; Lung cancer in her father; Stroke in her brother, brother, and mother.    ROS:  Please see the history of present illness.   Otherwise, review of systems are positive for arm pai .   All other systems are reviewed and negative.    PHYSICAL EXAM: VS:  BP 140/90   Pulse 74   Ht 5' 1.5" (1.562 m)   Wt 164 lb 12.8 oz (74.8 kg)   SpO2 99%   BMI 30.63 kg/m  , BMI Body mass index is 30.63 kg/m. GEN: Well nourished, well developed, in no acute distress  HEENT: normal  Neck: no JVD, carotid bruits, or masses Cardiac: RRR; no murmurs, rubs, or gallops,no edema  Respiratory:  clear to  auscultation bilaterally, normal work of breathing GI: soft, nontender, nondistended, + BS MS: no deformity or atrophy ; 2+ radial  bilaterally Skin: warm and dry, no rash Neuro:  Strength and sensation are intact Psych: euthymic mood, full affect   EKG:   The ekg ordered today demonstrates NSR, no ST changes   Recent Labs: 06/25/2019: BUN 10; Creatinine, Ser 1.00; Potassium 3.6; Sodium 135 07/11/2019: ALT 15; Hemoglobin 14.2; Platelets 324.0 03/10/2020: TSH 0.97   Lipid Panel    Component Value Date/Time   CHOL 160 06/25/2019 1103   TRIG 99.0 06/25/2019 1103   HDL 47.00 06/25/2019 1103   CHOLHDL 3 06/25/2019 1103   VLDL 19.8 06/25/2019 1103   LDLCALC 94 06/25/2019 1103     Other studies Reviewed: Additional studies/ records that were reviewed today with results demonstrating: LDL 94 in March 2021.   ASSESSMENT AND PLAN:  1. DOE: Will plan for echo.   Gradually increase activity.  She did feel reassured that her EKG did not show any significant findings. 2. HTN: Low salt diet.  Increase activity to target below.  Stress from family members dying from COVID.  If BP does not reduce with more exercise, may need to increase meds.   3. PreDM: A1C 6.1.  Increase fiber intake.  Regular exercise will also be helpful.  None good thanks   Current medicines are reviewed at length with the patient today.  The patient concerns regarding her medicines were addressed.  The following changes have been made:  No change  Labs/ tests ordered today include:  No orders of the defined types were placed in this encounter.   Recommend 150 minutes/week of aerobic exercise Low fat, low carb, high fiber diet recommended  Disposition:   FU in 1 year   Signed, Lance Muss, MD  06/04/2020 9:32 AM    [facility] [address] [place], [place], [place]  [address] Phone: [phone]; Fax: [phone]

## 2020-06-04 ENCOUNTER — Encounter: Payer: Self-pay | Admitting: Interventional Cardiology

## 2020-06-04 ENCOUNTER — Ambulatory Visit: Payer: Medicare Other | Admitting: Interventional Cardiology

## 2020-06-04 ENCOUNTER — Other Ambulatory Visit: Payer: Self-pay

## 2020-06-04 VITALS — BP 140/90 | HR 74 | Ht 61.5 in | Wt 164.8 lb

## 2020-06-04 DIAGNOSIS — R0609 Other forms of dyspnea: Secondary | ICD-10-CM

## 2020-06-04 DIAGNOSIS — R0600 Dyspnea, unspecified: Secondary | ICD-10-CM

## 2020-06-04 DIAGNOSIS — R7303 Prediabetes: Secondary | ICD-10-CM

## 2020-06-04 DIAGNOSIS — I10 Essential (primary) hypertension: Secondary | ICD-10-CM | POA: Diagnosis not present

## 2020-06-04 NOTE — Patient Instructions (Signed)
Medication Instructions:  Your physician recommends that you continue on your current medications as directed. Please refer to the Current Medication list given to you today.  *If you need a refill on your cardiac medications before your next appointment, please call your pharmacy*   Lab Work: none If you have labs (blood work) drawn today and your tests are completely normal, you will receive your results only by: Marland Kitchen MyChart Message (if you have MyChart) OR . A paper copy in the mail If you have any lab test that is abnormal or we need to change your treatment, we will call you to review the results.   Testing/Procedures: Your physician has requested that you have an echocardiogram. Echocardiography is a painless test that uses sound waves to create images of your heart. It provides your doctor with information about the size and shape of your heart and how well your heart's chambers and valves are working. This procedure takes approximately one hour. There are no restrictions for this procedure.     Follow-Up: At [facility], you and your health needs are our priority.  As part of our continuing mission to provide you with exceptional heart care, we have created designated Provider Care Teams.  These Care Teams include your primary Cardiologist (physician) and Advanced Practice Providers (APPs -  Physician Assistants and Nurse Practitioners) who all work together to provide you with the care you need, when you need it.  We recommend signing up for the patient portal called "MyChart".  Sign up information is provided on this After Visit Summary.  MyChart is used to connect with patients for Virtual Visits (Telemedicine).  Patients are able to view lab/test results, encounter notes, upcoming appointments, etc.  Non-urgent messages can be sent to your provider as well.   To learn more about what you can do with MyChart, go to [email or web address].    Your next appointment:   As  needed  The format for your next appointment:   In Person  Provider:   Dr Eldridge Dace   Other Instructions

## 2020-06-20 ENCOUNTER — Telehealth: Payer: Self-pay | Admitting: Endocrinology

## 2020-06-20 DIAGNOSIS — I10 Essential (primary) hypertension: Secondary | ICD-10-CM | POA: Diagnosis not present

## 2020-06-20 DIAGNOSIS — K219 Gastro-esophageal reflux disease without esophagitis: Secondary | ICD-10-CM | POA: Diagnosis not present

## 2020-06-20 DIAGNOSIS — E041 Nontoxic single thyroid nodule: Secondary | ICD-10-CM | POA: Diagnosis not present

## 2020-06-20 DIAGNOSIS — M19041 Primary osteoarthritis, right hand: Secondary | ICD-10-CM | POA: Diagnosis not present

## 2020-06-20 NOTE — Telephone Encounter (Signed)
Received a call from [facility] regarding an urgent fax for pt, told them I would be on the lookout for it.  Received the fax that was labeled "STAT RECORD REQUEST for [facility]" and sent the fax over to Medical Records to release. Received "ok" result at 11:31 am

## 2020-06-23 DIAGNOSIS — Z20822 Contact with and (suspected) exposure to covid-19: Secondary | ICD-10-CM | POA: Diagnosis not present

## 2020-07-01 ENCOUNTER — Other Ambulatory Visit: Payer: Self-pay

## 2020-07-01 ENCOUNTER — Ambulatory Visit (HOSPITAL_COMMUNITY): Payer: Medicare Other | Attending: Internal Medicine

## 2020-07-01 DIAGNOSIS — I10 Essential (primary) hypertension: Secondary | ICD-10-CM | POA: Insufficient documentation

## 2020-07-01 DIAGNOSIS — R0600 Dyspnea, unspecified: Secondary | ICD-10-CM | POA: Diagnosis not present

## 2020-07-01 DIAGNOSIS — R0609 Other forms of dyspnea: Secondary | ICD-10-CM

## 2020-07-01 LAB — ECHOCARDIOGRAM COMPLETE
Area-P 1/2: 4.8 cm2
S' Lateral: 2.2 cm

## 2020-10-27 DIAGNOSIS — U071 COVID-19: Secondary | ICD-10-CM | POA: Diagnosis not present

## 2020-10-30 ENCOUNTER — Telehealth: Payer: Self-pay | Admitting: Family Medicine

## 2020-10-30 NOTE — Telephone Encounter (Signed)
This patient is no longer a patient at [facility]

## 2020-10-30 NOTE — Telephone Encounter (Signed)
Left message for patient to call back and schedule Medicare Annual Wellness Visit (AWV) either virtually or in office.   Last AWV 11/16/19  please schedule at anytime with [facility] Nurse Health Advisor 1 or 2   Please confirm with patient that she is still a patient @ [facility].  Epic had different PCP  This should be a 45 minute visit.

## 2020-11-26 DIAGNOSIS — E785 Hyperlipidemia, unspecified: Secondary | ICD-10-CM | POA: Diagnosis not present

## 2020-11-26 DIAGNOSIS — M19049 Primary osteoarthritis, unspecified hand: Secondary | ICD-10-CM | POA: Diagnosis not present

## 2020-11-26 DIAGNOSIS — K219 Gastro-esophageal reflux disease without esophagitis: Secondary | ICD-10-CM | POA: Diagnosis not present

## 2020-11-26 DIAGNOSIS — M79674 Pain in right toe(s): Secondary | ICD-10-CM | POA: Diagnosis not present

## 2020-11-26 DIAGNOSIS — L678 Other hair color and hair shaft abnormalities: Secondary | ICD-10-CM | POA: Diagnosis not present

## 2020-12-03 ENCOUNTER — Ambulatory Visit
Admission: RE | Admit: 2020-12-03 | Discharge: 2020-12-03 | Disposition: A | Discharge status: Home / Self Care | Payer: Medicare Other | Source: Ambulatory Visit | Attending: Family Medicine | Admitting: Family Medicine

## 2020-12-03 ENCOUNTER — Other Ambulatory Visit: Payer: Self-pay | Admitting: Family Medicine

## 2020-12-03 DIAGNOSIS — M19049 Primary osteoarthritis, unspecified hand: Secondary | ICD-10-CM

## 2020-12-03 DIAGNOSIS — M19041 Primary osteoarthritis, right hand: Secondary | ICD-10-CM | POA: Diagnosis not present

## 2020-12-23 ENCOUNTER — Telehealth: Payer: Self-pay | Admitting: *Deleted

## 2020-12-23 NOTE — Telephone Encounter (Signed)
Left message on machine to call back and schedule AWV

## 2021-03-11 ENCOUNTER — Other Ambulatory Visit: Payer: Self-pay

## 2021-03-11 ENCOUNTER — Ambulatory Visit: Payer: Medicare Other | Admitting: Endocrinology

## 2021-03-11 VITALS — BP 146/94 | HR 72 | Ht 61.5 in | Wt 166.8 lb

## 2021-03-11 DIAGNOSIS — R490 Dysphonia: Secondary | ICD-10-CM | POA: Diagnosis not present

## 2021-03-11 DIAGNOSIS — Z23 Encounter for immunization: Secondary | ICD-10-CM

## 2021-03-11 DIAGNOSIS — E042 Nontoxic multinodular goiter: Secondary | ICD-10-CM

## 2021-03-11 LAB — TSH: TSH: 1.34 u[IU]/mL (ref 0.35–5.50)

## 2021-03-11 LAB — T4, FREE: Free T4: 0.75 ng/dL (ref 0.60–1.60)

## 2021-03-11 NOTE — Patient Instructions (Addendum)
Your blood pressure is high today.  Please see your primary care provider soon, to have it rechecked.   Thyroid blood tests are requested for you today.  We'll let you know about the results.   Let's recheck the ultrasound.  you will receive a phone call, about a day and time for an appointment.   Please see an ear-nose-throat specialist.  you will receive a phone call, about a day and time for an appointment.   Please come back for a follow-up appointment in 1 year.

## 2021-03-11 NOTE — Progress Notes (Signed)
Subjective:    Patient ID: Latasha Young, female    DOB: 1950/12/16, 70 y.o.   MRN: [id number]  HPI Pt returns for f/u of multinodular thyroid (dx'ed 2018; bx then was: R: cat 2, and L: cat 1; she is euthyroid off rx; f/u US is 2020 was unchanged; she is euthyroid off rx).  pt states hoarseness x a few months.  Non-smoker.   Past Medical History:  Diagnosis Date   Allergy    Cataract    "beginnings of"   GERD (gastroesophageal reflux disease)    Hyperlipidemia    Hypertension    Thyroid disease    2 benign nodules noted- check yearly   Vitamin D deficiency     Past Surgical History:  Procedure Laterality Date   ABDOMINAL HYSTERECTOMY     SIGMOIDOSCOPY     WISDOM TOOTH EXTRACTION      Social History   Socioeconomic History   Marital status: Divorced    Spouse name: Not on file   Number of children: Not on file   Years of education: Not on file   Highest education level: Not on file  Occupational History   Not on file  Tobacco Use   Smoking status: Never   Smokeless tobacco: Never  Vaping Use   Vaping Use: Never used  Substance and Sexual Activity   Alcohol use: Yes    Comment: Occ   Drug use: No   Sexual activity: Not on file  Other Topics Concern   Not on file  Social History Narrative   Not on file   Social Determinants of Health   Financial Resource Strain: Not on file  Food Insecurity: Not on file  Transportation Needs: Not on file  Physical Activity: Not on file  Stress: Not on file  Social Connections: Not on file  Intimate Partner Violence: Not on file    Current Outpatient Medications on File Prior to Visit  Medication Sig Dispense Refill   amLODipine-benazepril (LOTREL) 5-20 MG capsule Take 1 capsule by mouth daily. 30 capsule 2   atorvastatin (LIPITOR) 40 MG tablet Take 1 tablet by mouth once daily 90 tablet 0   Cholecalciferol (VITAMIN D3) 50000 units TABS Take by mouth once a week.     metoprolol succinate (TOPROL-XL) 25 MG 24 hr  tablet Take 1 tablet by mouth once daily 90 tablet 1   Multiple Vitamins-Minerals (MULTIVITAMIN ADULT PO) Take by mouth daily.     Omega-3 Fatty Acids (OMEGA-3 CF PO) Take by mouth.     triamterene-hydrochlorothiazide (DYAZIDE) 37.5-25 MG capsule Take 1 capsule by mouth daily.     pantoprazole (PROTONIX) 40 MG tablet Take 1 tablet (40 mg total) by mouth daily. 30 tablet 3   No current facility-administered medications on file prior to visit.    Allergies  Allergen Reactions   Penicillins Hives    Family History  Problem Relation Age of Onset   Stroke Mother    Hypertension Mother    Cancer Father    Lung cancer Father    Stroke Brother    Hypertension Maternal Grandmother    Hypertension Maternal Grandfather    Alcohol abuse Brother    Hypertension Brother    Stroke Brother    Cancer Sister    Goiter Sister    Colon cancer Neg Hx    Esophageal cancer Neg Hx    Rectal cancer Neg Hx    Stomach cancer Neg Hx     BP (!) 146/94  Pulse 72   Ht 5' 1.5" (1.562 m)   Wt 166 lb 12.8 oz (75.7 kg)   SpO2 97%   BMI 31.01 kg/m    Review of Systems Denies sob    Objective:   Physical Exam VITAL SIGNS:  See vs page.   GENERAL: no distress.   NECK: right thyroid swelling is again noted, but I can't tell details.     Lab Results  Component Value Date   TSH 1.34 03/11/2021      Assessment & Plan:  MNG: due for recheck.  No medication is needed Hoarseness, uncertain etiology and prognosis  Patient Instructions  Your blood pressure is high today.  Please see your primary care provider soon, to have it rechecked.   Thyroid blood tests are requested for you today.  We'll let you know about the results.   Let's recheck the ultrasound.  you will receive a phone call, about a day and time for an appointment.   Please see an ear-nose-throat specialist.  you will receive a phone call, about a day and time for an appointment.   Please come back for a follow-up appointment in 1  year.

## 2021-03-18 DIAGNOSIS — R944 Abnormal results of kidney function studies: Secondary | ICD-10-CM | POA: Diagnosis not present

## 2021-03-18 DIAGNOSIS — K219 Gastro-esophageal reflux disease without esophagitis: Secondary | ICD-10-CM | POA: Diagnosis not present

## 2021-03-18 DIAGNOSIS — E042 Nontoxic multinodular goiter: Secondary | ICD-10-CM | POA: Diagnosis not present

## 2021-03-18 DIAGNOSIS — R490 Dysphonia: Secondary | ICD-10-CM | POA: Diagnosis not present

## 2021-03-19 ENCOUNTER — Ambulatory Visit
Admission: RE | Admit: 2021-03-19 | Discharge: 2021-03-19 | Disposition: A | Discharge status: Home / Self Care | Payer: Medicare Other | Source: Ambulatory Visit | Attending: Endocrinology | Admitting: Endocrinology

## 2021-03-19 DIAGNOSIS — E042 Nontoxic multinodular goiter: Secondary | ICD-10-CM

## 2021-03-25 ENCOUNTER — Other Ambulatory Visit: Payer: Self-pay | Admitting: Family Medicine

## 2021-03-25 DIAGNOSIS — Z1231 Encounter for screening mammogram for malignant neoplasm of breast: Secondary | ICD-10-CM

## 2021-04-07 ENCOUNTER — Other Ambulatory Visit: Payer: Self-pay | Admitting: Family Medicine

## 2021-04-07 DIAGNOSIS — Z13820 Encounter for screening for osteoporosis: Secondary | ICD-10-CM

## 2021-04-27 ENCOUNTER — Ambulatory Visit
Admission: RE | Admit: 2021-04-27 | Discharge: 2021-04-27 | Disposition: A | Discharge status: Home / Self Care | Payer: Medicare Other | Source: Ambulatory Visit | Attending: Family Medicine | Admitting: Family Medicine

## 2021-04-27 DIAGNOSIS — Z1231 Encounter for screening mammogram for malignant neoplasm of breast: Secondary | ICD-10-CM

## 2021-04-28 ENCOUNTER — Other Ambulatory Visit: Payer: Self-pay

## 2021-04-28 ENCOUNTER — Ambulatory Visit
Admission: RE | Admit: 2021-04-28 | Discharge: 2021-04-28 | Disposition: A | Discharge status: Home / Self Care | Payer: Medicare Other | Source: Ambulatory Visit | Attending: Family Medicine | Admitting: Family Medicine

## 2021-04-28 DIAGNOSIS — Z780 Asymptomatic menopausal state: Secondary | ICD-10-CM | POA: Diagnosis not present

## 2021-04-28 DIAGNOSIS — Z13820 Encounter for screening for osteoporosis: Secondary | ICD-10-CM

## 2021-07-06 DIAGNOSIS — H524 Presbyopia: Secondary | ICD-10-CM | POA: Diagnosis not present

## 2021-07-07 DIAGNOSIS — L680 Hirsutism: Secondary | ICD-10-CM | POA: Diagnosis not present

## 2021-07-07 DIAGNOSIS — D2362 Other benign neoplasm of skin of left upper limb, including shoulder: Secondary | ICD-10-CM | POA: Diagnosis not present

## 2021-07-20 DIAGNOSIS — E785 Hyperlipidemia, unspecified: Secondary | ICD-10-CM | POA: Diagnosis not present

## 2021-07-20 DIAGNOSIS — R109 Unspecified abdominal pain: Secondary | ICD-10-CM | POA: Diagnosis not present

## 2021-07-20 DIAGNOSIS — D751 Secondary polycythemia: Secondary | ICD-10-CM | POA: Diagnosis not present

## 2021-07-22 ENCOUNTER — Ambulatory Visit
Admission: RE | Admit: 2021-07-22 | Discharge: 2021-07-22 | Disposition: A | Discharge status: Home / Self Care | Payer: Medicare Other | Source: Ambulatory Visit | Attending: Family Medicine | Admitting: Family Medicine

## 2021-07-22 ENCOUNTER — Other Ambulatory Visit: Payer: Self-pay | Admitting: Family Medicine

## 2021-07-22 DIAGNOSIS — R109 Unspecified abdominal pain: Secondary | ICD-10-CM

## 2021-07-22 DIAGNOSIS — K5900 Constipation, unspecified: Secondary | ICD-10-CM

## 2021-07-22 DIAGNOSIS — Z0000 Encounter for general adult medical examination without abnormal findings: Secondary | ICD-10-CM | POA: Diagnosis not present

## 2021-07-22 DIAGNOSIS — I10 Essential (primary) hypertension: Secondary | ICD-10-CM | POA: Diagnosis not present

## 2021-07-22 DIAGNOSIS — E785 Hyperlipidemia, unspecified: Secondary | ICD-10-CM | POA: Diagnosis not present

## 2021-07-22 DIAGNOSIS — E559 Vitamin D deficiency, unspecified: Secondary | ICD-10-CM | POA: Diagnosis not present

## 2021-08-17 ENCOUNTER — Ambulatory Visit: Payer: Medicare Other | Admitting: Endocrinology

## 2021-11-23 ENCOUNTER — Emergency Department (HOSPITAL_COMMUNITY): Payer: Medicare Other

## 2021-11-23 ENCOUNTER — Emergency Department (HOSPITAL_COMMUNITY)
Admission: EM | Admit: 2021-11-23 | Discharge: 2021-11-23 | Disposition: A | Discharge status: Home / Self Care | Payer: Medicare Other | Attending: Emergency Medicine | Admitting: Emergency Medicine

## 2021-11-23 ENCOUNTER — Other Ambulatory Visit: Payer: Self-pay

## 2021-11-23 ENCOUNTER — Encounter (HOSPITAL_COMMUNITY): Payer: Self-pay

## 2021-11-23 DIAGNOSIS — R079 Chest pain, unspecified: Secondary | ICD-10-CM | POA: Diagnosis not present

## 2021-11-23 DIAGNOSIS — M546 Pain in thoracic spine: Secondary | ICD-10-CM | POA: Diagnosis not present

## 2021-11-23 DIAGNOSIS — M79602 Pain in left arm: Secondary | ICD-10-CM | POA: Diagnosis not present

## 2021-11-23 DIAGNOSIS — R0789 Other chest pain: Secondary | ICD-10-CM | POA: Diagnosis not present

## 2021-11-23 DIAGNOSIS — E876 Hypokalemia: Secondary | ICD-10-CM | POA: Diagnosis not present

## 2021-11-23 LAB — BASIC METABOLIC PANEL
Anion gap: 7 (ref 5–15)
BUN: 7 mg/dL — ABNORMAL LOW (ref 8–23)
CO2: 24 mmol/L (ref 22–32)
Calcium: 9.2 mg/dL (ref 8.9–10.3)
Chloride: 104 mmol/L (ref 98–111)
Creatinine, Ser: 0.92 mg/dL (ref 0.44–1.00)
GFR, Estimated: >60 mL/min (ref >60)
Glucose, Bld: 98 mg/dL (ref 70–99)
Potassium: 3.4 mmol/L — ABNORMAL LOW (ref 3.5–5.1)
Sodium: 135 mmol/L (ref 135–145)

## 2021-11-23 LAB — CBC
HCT: 43.7 % (ref 36.0–46.0)
Hemoglobin: 14.3 g/dL (ref 12.0–15.0)
MCH: 29.5 pg (ref 26.0–34.0)
MCHC: 32.7 g/dL (ref 30.0–36.0)
MCV: 90.3 fL (ref 80.0–100.0)
Platelets: 265 10*3/uL (ref 150–400)
RBC: 4.84 MIL/uL (ref 3.87–5.11)
RDW: 12.7 % (ref 11.5–15.5)
WBC: 5.3 10*3/uL (ref 4.0–10.5)
nRBC: 0 % (ref 0.0–0.2)

## 2021-11-23 LAB — TROPONIN I (HIGH SENSITIVITY)
Troponin I (High Sensitivity): 3 ng/L (ref <18)
Troponin I (High Sensitivity): 3 ng/L (ref <18)

## 2021-11-23 LAB — D-DIMER, QUANTITATIVE: D-Dimer, Quant: 0.57 ug/mL-FEU — ABNORMAL HIGH (ref 0.00–0.50)

## 2021-11-23 MED ORDER — METHOCARBAMOL 500 MG PO TABS: 500.0000 mg | ORAL_TABLET | Freq: Two times a day (BID) | ORAL | 0 refills | Status: DC | PRN

## 2021-11-23 MED ORDER — METHOCARBAMOL 500 MG PO TABS
500.0000 mg | ORAL_TABLET | Freq: Once | ORAL | Status: AC
  Administered 2021-11-23: 500 mg via ORAL
  Filled 2021-11-23: qty 1

## 2021-11-23 MED ORDER — POTASSIUM CHLORIDE CRYS ER 20 MEQ PO TBCR
20.0000 meq | EXTENDED_RELEASE_TABLET | Freq: Once | ORAL | Status: AC
  Administered 2021-11-23: 20 meq via ORAL
  Filled 2021-11-23: qty 1

## 2021-11-23 MED ORDER — LIDOCAINE 5 % EX PTCH: 1.0000 | MEDICATED_PATCH | CUTANEOUS | 0 refills | Status: DC

## 2021-11-23 MED ORDER — LIDOCAINE 5 % EX PTCH
1.0000 | MEDICATED_PATCH | Freq: Once | CUTANEOUS | Status: DC
  Administered 2021-11-23: 1 via TRANSDERMAL
  Filled 2021-11-23: qty 1

## 2021-11-23 MED ORDER — IBUPROFEN 400 MG PO TABS
400.0000 mg | ORAL_TABLET | Freq: Once | ORAL | Status: AC
  Administered 2021-11-23: 400 mg via ORAL
  Filled 2021-11-23: qty 1

## 2021-11-23 NOTE — Discharge Instructions (Signed)
If you develop worsening, recurrent, or continued back pain, numbness or weakness in the arms or legs, incontinence of your bowels or bladders, numbness of your buttocks, fever, chest or abdominal pain, or any other new/concerning symptoms then return to the ER for evaluation.

## 2021-11-23 NOTE — ED Provider Notes (Signed)
[facility] EMERGENCY DEPARTMENT Provider Note   CSN: [id number] Arrival date & time: 11/23/21  1121     History  Chief Complaint  Patient presents with   Chest Pain    Latasha Young is a 71 y.o. female.  HPI 71 year old female presents with a chief complaint of left arm pain. Has been ongoing for about 4 days. Started while on a cruise. Primarily is in her left trapezius/upper back. Her whole arm/hand hurts as well. No numbness or weakness. 800 mg ibuprofen has partially helped. No swelling noted by patient. Today developed pain in left chest as well. Has been coming and going. No dyspnea. Pain is worse in different positions or using arm. No fevers or trauma.   Home Medications Prior to Admission medications   Medication Sig Start Date End Date Taking? Authorizing Provider  lidocaine (LIDODERM) 5 % Place 1 patch onto the skin daily. Remove & Discard patch within 12 hours or as directed by MD 11/23/21  Yes Pricilla Loveless, MD  methocarbamol (ROBAXIN) 500 MG tablet Take 1 tablet (500 mg total) by mouth 2 (two) times daily as needed for muscle spasms. 11/23/21  Yes Pricilla Loveless, MD  amLODipine-benazepril (LOTREL) 5-20 MG capsule Take 1 capsule by mouth daily. 06/25/19   [place], Betty G, MD  atorvastatin (LIPITOR) 40 MG tablet Take 1 tablet by mouth once daily 06/12/18   [place], Betty G, MD  Cholecalciferol (VITAMIN D3) 50000 units TABS Take by mouth once a week.    [provider]  metoprolol succinate (TOPROL-XL) 25 MG 24 hr tablet Take 1 tablet by mouth once daily 11/21/19   [place], Betty G, MD  Multiple Vitamins-Minerals (MULTIVITAMIN ADULT PO) Take by mouth daily.    [provider]  Omega-3 Fatty Acids (OMEGA-3 CF PO) Take by mouth.    [provider]  pantoprazole (PROTONIX) 40 MG tablet Take 1 tablet (40 mg total) by mouth daily. 03/27/19   [place], Betty G, MD  triamterene-hydrochlorothiazide (DYAZIDE) 37.5-25 MG capsule Take 1 capsule by  mouth daily.    [provider]      Allergies    Penicillins    Review of Systems   Review of Systems  Constitutional:  Negative for fever.  Respiratory:  Negative for shortness of breath.   Cardiovascular:  Positive for chest pain.  Musculoskeletal:  Positive for back pain and myalgias.  Neurological:  Negative for weakness and numbness.    Physical Exam Updated Vital Signs BP (!) 149/84 (BP Location: Right Arm)   Pulse (!) 56   Temp 97.9 F (36.6 C) (Oral)   Resp 15   Ht 5\' 1"  (1.549 m)   Wt 75.3 kg   SpO2 100%   BMI 31.37 kg/m  Physical Exam Vitals and nursing note reviewed.  Constitutional:      General: She is not in acute distress.    Appearance: She is well-developed. She is not ill-appearing or diaphoretic.  HENT:     Head: Normocephalic and atraumatic.  Cardiovascular:     Rate and Rhythm: Normal rate and regular rhythm.     Pulses:          Radial pulses are 2+ on the left side.     Heart sounds: Normal heart sounds.  Pulmonary:     Effort: Pulmonary effort is normal.     Breath sounds: Normal breath sounds.  Abdominal:     Palpations: Abdomen is soft.     Tenderness: There  is no abdominal tenderness.  Musculoskeletal:     Left shoulder: No tenderness. Normal range of motion.     Left upper arm: No swelling or tenderness.       Back:  Skin:    General: Skin is warm and dry.  Neurological:     Mental Status: She is alert.     Comments: 5/5 strength in BUE. Grossly normal sensation     ED Results / Procedures / Treatments   Labs (all labs ordered are listed, but only abnormal results are displayed) Labs Reviewed  BASIC METABOLIC PANEL - Abnormal; Notable for the following components:      Result Value   Potassium 3.4 (*)    BUN 7 (*)    All other components within normal limits  D-DIMER, QUANTITATIVE - Abnormal; Notable for the following components:   D-Dimer, Quant 0.57 (*)    All other components within normal limits  CBC   TROPONIN I (HIGH SENSITIVITY)  TROPONIN I (HIGH SENSITIVITY)    EKG EKG Interpretation  Date/Time:  Monday November 23 2021 11:46:08 EDT Ventricular Rate:  79 PR Interval:  188 QRS Duration: 74 QT Interval:  368 QTC Calculation: 421 R Axis:   50 Text Interpretation: Normal sinus rhythm Low voltage QRS Cannot rule out Anterior infarct , age undetermined Abnormal ECG No previous ECGs available Confirmed by Pricilla Loveless [phone]) on 11/23/2021 1:29:53 PM  Radiology DG Chest 2 View  Result Date: 11/23/2021 CLINICAL DATA:  Chest pain EXAM: CHEST - 2 VIEW COMPARISON:  05/02/2008 FINDINGS: The heart size and mediastinal contours are within normal limits. Both lungs are clear. The visualized skeletal structures are unremarkable. IMPRESSION: No active cardiopulmonary disease. Electronically Signed   By: Ernie Avena M.D.   On: 11/23/2021 12:35    Procedures Procedures    Medications Ordered in ED Medications  lidocaine (LIDODERM) 5 % 1 patch (1 patch Transdermal Patch Applied 11/23/21 1551)  methocarbamol (ROBAXIN) tablet 500 mg (500 mg Oral Given 11/23/21 1408)  potassium chloride SA (KLOR-CON M) CR tablet 20 mEq (20 mEq Oral Given 11/23/21 1408)  ibuprofen (ADVIL) tablet 400 mg (400 mg Oral Given 11/23/21 1408)    ED Course/ Medical Decision Making/ A&P                           Medical Decision Making Amount and/or Complexity of Data Reviewed Labs: ordered.    Details: Troponins negative x2.  Mild hypokalemia at 3.4 Radiology: independent interpretation performed.    Details: No pneumothorax ECG/medicine tests: independent interpretation performed.    Details: No acute ischemia  Risk Prescription drug management.   Patient presentation seems most consistent with muscle strain/spasm to her left trapezius.  She is feeling somewhat better after ibuprofen and Robaxin here.  We will also apply Lidoderm patch.  No evidence of neurovascular compromise or need for emergent CT or  MRI.  At this point, she is feeling better and I think amenable to outpatient treatment.  I think the chest pain is likely coming from her muscle strain/spasm rather than cardiac emergency such as ACS, PE, etc.  Will discharge home with return precautions.        Final Clinical Impression(s) / ED Diagnoses Final diagnoses:  Acute left-sided thoracic back pain    Rx / DC Orders ED Discharge Orders          Ordered    methocarbamol (ROBAXIN) 500 MG tablet  2 times daily PRN  11/23/21 1534    lidocaine (LIDODERM) 5 %  Every 24 hours        11/23/21 1534              Sherwood Gambler, MD 11/23/21 1555

## 2021-11-23 NOTE — ED Provider Triage Note (Signed)
Emergency Medicine Provider Triage Evaluation Note  Latasha Young , a 71 y.o. female  was evaluated in triage.  Pt complains of left arm and back pain, now radiating to the chest since around Thursday last week.  Patient endorses history of hypertension, hyperlipidemia, denies tobacco use or diabetes.  Patient was recently on a trip with a long flight to [place], reports that the arm pain started before the flight.  She denies any swelling of the arm.  The arm pain comes and goes.  Seems worse when she is sitting up, and with exertion.  She denies any shortness of breath, fever, chills.  Reports clean cardiac work-up March 2022.  Review of Systems  Positive: Chest pain, arm pain, back pain Negative: Nausea, vomiting, fever, chills  Physical Exam  BP (!) 153/98   Pulse 82   Temp 98.3 F (36.8 C) (Oral)   Resp 16   Ht 5\' 1"  (1.549 m)   Wt 75.3 kg   SpO2 98%   BMI 31.37 kg/m  Gen:   Awake, no distress   Resp:  Normal effort  MSK:   Moves extremities without difficulty  Other:   Medical Decision Making  Medically screening exam initiated at 12:05 PM.  Appropriate orders placed.  Latasha Young was informed that the remainder of the evaluation will be completed by another provider, this initial triage assessment does not replace that evaluation, and the importance of remaining in the ED until their evaluation is complete.  Workup initiated   Latasha Young, Latasha Young 11/23/21 1206

## 2021-11-23 NOTE — ED Triage Notes (Addendum)
Patient complains of pain in l;eft chest/shoulder and arm since Thursday.  Reports its in her back.  REcently flew back from a cruise in [place] last night

## 2021-11-24 DIAGNOSIS — G4733 Obstructive sleep apnea (adult) (pediatric): Secondary | ICD-10-CM | POA: Diagnosis not present

## 2021-11-24 DIAGNOSIS — I10 Essential (primary) hypertension: Secondary | ICD-10-CM | POA: Diagnosis not present

## 2021-11-24 DIAGNOSIS — M79602 Pain in left arm: Secondary | ICD-10-CM | POA: Diagnosis not present

## 2021-11-24 DIAGNOSIS — M546 Pain in thoracic spine: Secondary | ICD-10-CM | POA: Diagnosis not present

## 2021-11-27 ENCOUNTER — Other Ambulatory Visit (HOSPITAL_BASED_OUTPATIENT_CLINIC_OR_DEPARTMENT_OTHER): Payer: Self-pay

## 2021-11-27 DIAGNOSIS — G4733 Obstructive sleep apnea (adult) (pediatric): Secondary | ICD-10-CM

## 2021-12-03 DIAGNOSIS — M7989 Other specified soft tissue disorders: Secondary | ICD-10-CM | POA: Diagnosis not present

## 2021-12-09 DIAGNOSIS — L680 Hirsutism: Secondary | ICD-10-CM | POA: Diagnosis not present

## 2021-12-11 ENCOUNTER — Ambulatory Visit: Payer: Medicare Other | Attending: Internal Medicine | Admitting: Occupational Therapy

## 2021-12-11 DIAGNOSIS — M6281 Muscle weakness (generalized): Secondary | ICD-10-CM | POA: Diagnosis not present

## 2021-12-11 DIAGNOSIS — R278 Other lack of coordination: Secondary | ICD-10-CM | POA: Diagnosis not present

## 2021-12-11 DIAGNOSIS — M79602 Pain in left arm: Secondary | ICD-10-CM | POA: Diagnosis not present

## 2021-12-11 DIAGNOSIS — M79642 Pain in left hand: Secondary | ICD-10-CM | POA: Diagnosis not present

## 2021-12-11 NOTE — Therapy (Signed)
OUTPATIENT OCCUPATIONAL THERAPY ORTHO EVALUATION  Patient Name: Latasha Young MRN: [id number] DOB:[birth year], 71 y.o., female Today's Date: 12/11/2021  PCP: Harvest Forest, MD REFERRING PROVIDER: Harvest Forest, MD    OT End of Session - 12/11/21 1117     Visit Number 1    Number of Visits 9    Date for OT Re-Evaluation 01/08/22    Authorization Type [employer] Medicare    OT Start Time 1104    OT Stop Time 1150    OT Time Calculation (min) 46 min             Past Medical History:  Diagnosis Date   Allergy    Cataract    "beginnings of"   GERD (gastroesophageal reflux disease)    Hyperlipidemia    Hypertension    Thyroid disease    2 benign nodules noted- check yearly   Vitamin D deficiency    Past Surgical History:  Procedure Laterality Date   ABDOMINAL HYSTERECTOMY     SIGMOIDOSCOPY     WISDOM TOOTH EXTRACTION     Patient Active Problem List   Diagnosis Date Noted   Hoarseness 03/11/2021   Atherosclerosis of aorta (HCC) 01/17/2020   Prediabetes 10/17/2018   Abnormal facial hair 10/12/2017   Multinodular goiter 12/23/2016   GERD (gastroesophageal reflux disease) 07/30/2016   Allergic rhinitis 07/30/2016   Hypertension, essential, benign 05/24/2016   Hyperlipidemia 05/24/2016   Class 1 obesity with body mass index (BMI) of 31.0 to 31.9 in adult 05/24/2016    ONSET DATE: ~11/19/21  REFERRING DIAG: H70.263 (ICD-10-CM) - Pain in left arm   THERAPY DIAG:  Pain in left arm  Pain in left hand  Muscle weakness (generalized)  Other lack of coordination  Rationale for Evaluation and Treatment Rehabilitation  SUBJECTIVE:   SUBJECTIVE STATEMENT: Pt reports she went to the ED as she thought she was having a heart attack.  She reports that she did have a fall 11/18/21 going up some steps in [place] and must have tried to brace herself/caught herself with her arm.  She reports that she still has some pain in fingers, volar side of hand, and up to shoulder and  trapezius.  Pt notes that she does not have any strength in her hand and therefore does not trust it to use it with much.   Pt accompanied by: self  PERTINENT HISTORY: N/A  PRECAUTIONS: Fall  WEIGHT BEARING RESTRICTIONS No  PAIN:  Are you having pain? Yes: NPRS scale: 4-5/10 Pain location: hand mostly with some soreness all the way up to the shoulder Pain description: soreness, stiffness Aggravating factors: using it Relieving factors: rest, ice, heat  FALLS: Has patient fallen in last 6 months? Yes. Number of falls 1 when in [place] she fell going up some steps  LIVING ENVIRONMENT: Lives with: lives with an adult companion Lives in: House/apartment Stairs: Yes: External: 3 steps; on right going up Has following equipment at home: bed side commode  PLOF: Independent, Independent with basic ADLs, Independent with household mobility without device, Independent with community mobility without device, and Independent with homemaking with ambulation  PATIENT GOALS to regain complete use of L hand/arm  OBJECTIVE:   HAND DOMINANCE: Right  ADLs: Transfers/ambulation related to ADLs: Mod I, reports that she does take her time especially when getting in/out of tubshower Grooming: Increased time, painful when attempting to style hair UB Dressing: occasional assist to manage buttons, reports "I am managing" LB Dressing: mild pain when pulling  pants up/down against resistance Tub Shower transfers: Mod I Equipment: bed side commode  FUNCTIONAL OUTCOME MEASURES: Quick Dash: 43%  UPPER EXTREMITY ROM     ROM Right eval Left AROM eval Left PROM eval  Shoulder flexion WNL 114 122  Shoulder abduction     Shoulder adduction     Shoulder extension     Shoulder internal rotation     Shoulder external rotation     Elbow flexion     Elbow extension     Wrist flexion  55   Wrist extension  50   Wrist ulnar deviation     Wrist radial deviation     Wrist pronation     Wrist supination      (Blank rows = not tested)    UPPER EXTREMITY MMT:     MMT Right eval Left eval  Shoulder flexion  4/5  Shoulder abduction    Shoulder adduction    Shoulder extension    Shoulder internal rotation    Shoulder external rotation    Middle trapezius    Lower trapezius    Elbow flexion  4/5  Elbow extension  4/5  Wrist flexion    Wrist extension    Wrist ulnar deviation    Wrist radial deviation    Wrist pronation    Wrist supination    (Blank rows = not tested)  COORDINATION: Box and Blocks:  Right 45 blocks, Left 35 blocks  SENSATION: Reports numbness in L hand  EDEMA: mild edema on volar side of hand  COGNITION: Overall cognitive status: Within functional limits for tasks assessed  OBSERVATIONS: guarding of LUE during all tasks during eval and ambulation in/out of treatment space   TODAY'S TREATMENT:  Cervical stretches: OT providing demonstration and verbal cues for Seated Neck Sidebending Stretch, Gentle Levator Scapulae Stretch, Seated Cervical Rotation AROM .  Pt completed each stretch x5 with reports of mild stretch but no increase in pain  PATIENT EDUCATION: Education details: Educated on role and purpose of OT as well as potential interventions and goals for therapy based on initial evaluation findings. Person educated: Patient Education method: Explanation, Demonstration, and Handouts Education comprehension: verbalized understanding and needs further education   HOME EXERCISE PROGRAM: Access Code: [email or web address] URL: [email or web address] Date: 12/11/2021 Prepared by: [facility] - Outpatient  Rehab - [facility]  Exercises - Seated Neck Sidebending Stretch  - 2 x daily - 2 sets - 10 reps - 10-20 hold - Gentle Levator Scapulae Stretch  - 2 x daily - 2 sets - 10 reps - 10-20 sec hold - Seated Cervical Rotation AROM  - 2 x daily - 2 sets - 10 reps - 10-20 hold   GOALS: Goals reviewed with patient? No   LONG TERM GOALS: Target  date:  01/08/22     Pt will be Independent with HEP for LUE strengthening and ROM. Baseline:  Goal status: INITIAL  2.  Pt will report ability to complete ADLs with increased ease, safety, and no increase in pain with use of AE/adaptive strategies PRN. Baseline:  Goal status: INITIAL  3.  Pt will demonstrate increased shoulder flexion to 125* to remove/place light weight objects at mod range. Baseline: 114* Goal status: INITIAL  4.  Pt will demonstrate improved UE functional use for ADLs as evidenced by increasing box/ blocks score by 5 blocks with LUE Baseline: R: 45 and L: 35 Goal status: INITIAL  5.  Pt will report improvements in functional use of LUE and decreased  pain as evidenced by improved score on QuickDash to <30% by d/c. Baseline: 43% Goal status: INITIAL   ASSESSMENT:  CLINICAL IMPRESSION: Patient is a 70 y.o. female who was seen today for occupational therapy evaluation for LUE pain s/p fall. Pt demonstrating limited shoulder ROM and strength in UE impacting ability to engage in ADLs/IADLs at PLOF.  Pt currently lives with housemate in a single level home with 3 steps to enter and is retired prior to onset.  Pt will benefit from skilled occupational therapy services to address strength and coordination, ROM, pain management, altered sensation, GM/FM control, safety awareness, introduction of compensatory strategies/AE prn, and implementation of an HEP to improve participation and safety during ADLs and IADLs.   PERFORMANCE DEFICITS in functional skills including ADLs, IADLs, coordination, dexterity, edema, ROM, strength, pain, FMC, GMC, and UE functional use.  IMPAIRMENTS are limiting patient from ADLs and IADLs.   COMORBIDITIES may have co-morbidities  that affects occupational performance. Patient will benefit from skilled OT to address above impairments and improve overall function.  MODIFICATION OR ASSISTANCE TO COMPLETE EVALUATION: Min-Moderate modification of  tasks or assist with assess necessary to complete an evaluation.  OT OCCUPATIONAL PROFILE AND HISTORY: Problem focused assessment: Including review of records relating to presenting problem.  CLINICAL DECISION MAKING: Moderate - several treatment options, min-mod task modification necessary  REHAB POTENTIAL: Good  EVALUATION COMPLEXITY: Low      PLAN: OT FREQUENCY: 1-2x/week  OT DURATION: 4 weeks  PLANNED INTERVENTIONS: self care/ADL training, therapeutic exercise, therapeutic activity, manual therapy, passive range of motion, aquatic therapy, electrical stimulation, ultrasound, moist heat, cryotherapy, patient/family education, energy conservation, and DME and/or AE instructions  RECOMMENDED OTHER SERVICES: NA  CONSULTED AND AGREED WITH PLAN OF CARE: Patient  PLAN FOR NEXT SESSION: review HEP, add to HEP as appropriate, engage in functional reach activities as able   [place], Page Pucciarelli, OTR/L 12/11/2021, 11:57 AM

## 2021-12-23 ENCOUNTER — Ambulatory Visit: Payer: Medicare Other | Admitting: Occupational Therapy

## 2021-12-23 DIAGNOSIS — M79642 Pain in left hand: Secondary | ICD-10-CM

## 2021-12-23 DIAGNOSIS — M79602 Pain in left arm: Secondary | ICD-10-CM

## 2021-12-23 DIAGNOSIS — M6281 Muscle weakness (generalized): Secondary | ICD-10-CM | POA: Diagnosis not present

## 2021-12-23 DIAGNOSIS — R278 Other lack of coordination: Secondary | ICD-10-CM

## 2021-12-23 NOTE — Therapy (Signed)
OUTPATIENT OCCUPATIONAL THERAPY ORTHO  Treatment Note  Patient Name: Latasha Young MRN: [id number] DOB:April 11, 1950, 71 y.o., female Today's Date: 12/23/2021  PCP: Harvest [place], MD REFERRING PROVIDER: Harvest Forest, MD    OT End of Session - 12/23/21 1617     Visit Number 2    Number of Visits 9    Date for OT Re-Evaluation 01/08/22    Authorization Type BCBS Medicare    OT Start Time 1536    OT Stop Time 1616    OT Time Calculation (min) 40 min              Past Medical History:  Diagnosis Date   Allergy    Cataract    "beginnings of"   GERD (gastroesophageal reflux disease)    Hyperlipidemia    Hypertension    Thyroid disease    2 benign nodules noted- check yearly   Vitamin D deficiency    Past Surgical History:  Procedure Laterality Date   ABDOMINAL HYSTERECTOMY     SIGMOIDOSCOPY     WISDOM TOOTH EXTRACTION     Patient Active Problem List   Diagnosis Date Noted   Hoarseness 03/11/2021   Atherosclerosis of aorta (HCC) 01/17/2020   Prediabetes 10/17/2018   Abnormal facial hair 10/12/2017   Multinodular goiter 12/23/2016   GERD (gastroesophageal reflux disease) 07/30/2016   Allergic rhinitis 07/30/2016   Hypertension, essential, benign 05/24/2016   Hyperlipidemia 05/24/2016   Class 1 obesity with body mass index (BMI) of 31.0 to 31.9 in adult 05/24/2016    ONSET DATE: ~11/19/21  REFERRING DIAG: M57.846 (ICD-10-CM) - Pain in left arm   THERAPY DIAG:  Muscle weakness (generalized)  Pain in left arm  Pain in left hand  Other lack of coordination  Rationale for Evaluation and Treatment Rehabilitation  SUBJECTIVE:   SUBJECTIVE STATEMENT: Pt reports improvements in coordination and grasp with ability to open items, but still notices some stiffness and discomfort but no pain. Pt accompanied by: self  PERTINENT HISTORY: N/A  PRECAUTIONS: Fall  WEIGHT BEARING RESTRICTIONS No  PAIN:  Are you having pain? No  FALLS: Has patient  fallen in last 6 months? Yes. Number of falls 1 when in [place] she fell going up some steps  LIVING ENVIRONMENT: Lives with: lives with an adult companion Lives in: House/apartment Stairs: Yes: External: 3 steps; on right going up Has following equipment at home: bed side commode  PLOF: Independent, Independent with basic ADLs, Independent with household mobility without device, Independent with community mobility without device, and Independent with homemaking with ambulation  PATIENT GOALS to regain complete use of L hand/arm  OBJECTIVE:   HAND DOMINANCE: Right  ADLs: Transfers/ambulation related to ADLs: Mod I, reports that she does take her time especially when getting in/out of tubshower Grooming: Increased time, painful when attempting to style hair UB Dressing: occasional assist to manage buttons, reports "I am managing" LB Dressing: mild pain when pulling pants up/down against resistance Tub Shower transfers: Mod I Equipment: bed side commode  FUNCTIONAL OUTCOME MEASURES: Quick Dash: 43%  UPPER EXTREMITY ROM     ROM Right eval Left AROM eval Left PROM eval  Shoulder flexion WNL 114 122  Shoulder abduction     Shoulder adduction     Shoulder extension     Shoulder internal rotation     Shoulder external rotation     Elbow flexion     Elbow extension     Wrist flexion  55   Wrist  extension  50   Wrist ulnar deviation     Wrist radial deviation     Wrist pronation     Wrist supination     (Blank rows = not tested)    UPPER EXTREMITY MMT:     MMT Right eval Left eval  Shoulder flexion  4/5  Shoulder abduction    Shoulder adduction    Shoulder extension    Shoulder internal rotation    Shoulder external rotation    Middle trapezius    Lower trapezius    Elbow flexion  4/5  Elbow extension  4/5  Wrist flexion    Wrist extension    Wrist ulnar deviation    Wrist radial deviation    Wrist pronation    Wrist supination    (Blank rows = not  tested)  COORDINATION: Box and Blocks:  Right 45 blocks, Left 35 blocks  SENSATION: Reports numbness in L hand  EDEMA: mild edema on volar side of hand  COGNITION: Overall cognitive status: Within functional limits for tasks assessed  OBSERVATIONS: guarding of LUE during all tasks during eval and ambulation in/out of treatment space  ----------------------------------------------------------------------------------------------------------------------------------------------------------------------  TODAY'S TREATMENT:  Cervical stretches: Reviewed Seated Neck Sidebending Stretch, Gentle Levator Scapulae Stretch, Seated Cervical Rotation AROM .  Pt completed each stretch x5 with reports of mild stretch but no increase in pain.  Pt demonstrating good carryover of each stretch. Supine stretches: Engaged in supine lower trunk rotation with focus on full stretch up through UE.  Pt reports increased pain, therefore encouraged pt to lower arms with decrease in onset of pain.  Attempted PNF D1 and D2 reaching in supine, however pt with increased grimacing and pain therefore terminated activity. Scapula/shoulder ROM:  Attempted PNF D1 and D2 reaching in sitting, however pt still with increased grimacing therefore aborted task.  OT directed pt in scapular retraction, shoulder circles, and shoulder "W" with and without external rotation.  Pt able to complete shoulder circles and scapular retraction with min cues for technique and slowed pace to ensure proper technique.  OT providing verbal cues, demonstration, and modeling with shoulder "W" and shoulder external rotation in 45* abduction.    PATIENT EDUCATION: Education details: Educated on role and purpose of OT as well as potential interventions and goals for therapy based on initial evaluation findings. Person educated: Patient Education method: Explanation, Demonstration, and Handouts Education comprehension: verbalized understanding and needs  further education   HOME EXERCISE PROGRAM: Access Code: [email or web address] URL: [email or web address] Date: 12/23/2021 Prepared by: [facility]  Exercises - Seated Neck Sidebending Stretch  - 2 x daily - 2 sets - 10 reps - 10-20 hold - Gentle Levator Scapulae Stretch  - 2 x daily - 2 sets - 10 reps - 10-20 sec hold - Seated Cervical Rotation AROM  - 2 x daily - 2 sets - 10 reps - 10-20 hold - Seated Scapular Retraction  - 2 x daily - 2 sets - 10 reps - 10 hold - Seated Shoulder Shrug Circles AROM Backward/Forward  - 2 x daily - 2 sets - 10 reps - Seated Shoulder W  - 2 x daily - 2 sets - 10 reps - Shoulder External Rotation in 45 Degrees Abduction  - 2 x daily - 2 sets - 10 reps - Supine Lower Trunk Rotation  - 1 x daily - 2 sets - 10 reps   GOALS: Goals reviewed with patient? Yes   LONG TERM GOALS:  Target date:  01/08/22     Pt will be Independent with HEP for LUE strengthening and ROM. Baseline:  Goal status: IN PROGRESS  2.  Pt will report ability to complete ADLs with increased ease, safety, and no increase in pain with use of AE/adaptive strategies PRN. Baseline:  Goal status: IN PROGRESS  3.  Pt will demonstrate increased shoulder flexion to 125* to remove/place light weight objects at mod range. Baseline: 114* Goal status: IN PROGRESS  4.  Pt will demonstrate improved UE functional use for ADLs as evidenced by increasing box/ blocks score by 5 blocks with LUE Baseline: R: 45 and L: 35 Goal status: IN PROGRESS  5.  Pt will report improvements in functional use of LUE and decreased pain as evidenced by improved score on QuickDash to <30% by d/c. Baseline: 43% Goal status: IN PROGRESS   ASSESSMENT:  CLINICAL IMPRESSION: Patient seen for first treatment s/p initial evaluation.  Therapist discussed established goals and reiterated POC with pt in agreement. Pt reports improved functional use of LUE during ADLs/IADLs.  Pt  demonstrating increased grimacing and pain in supine position, able to tolerate scapular retraction, shoulder circles, and internal/external rotation in sitting with decreased grimacing.  Discussed engagement in HEP to tolerance and discussed modifications as necessary if pain prohibiting movements.  PERFORMANCE DEFICITS in functional skills including ADLs, IADLs, coordination, dexterity, edema, ROM, strength, pain, FMC, GMC, and UE functional use.  IMPAIRMENTS are limiting patient from ADLs and IADLs.   COMORBIDITIES may have co-morbidities  that affects occupational performance. Patient will benefit from skilled OT to address above impairments and improve overall function.  MODIFICATION OR ASSISTANCE TO COMPLETE EVALUATION: Min-Moderate modification of tasks or assist with assess necessary to complete an evaluation.  OT OCCUPATIONAL PROFILE AND HISTORY: Problem focused assessment: Including review of records relating to presenting problem.  CLINICAL DECISION MAKING: Moderate - several treatment options, min-mod task modification necessary  REHAB POTENTIAL: Good  EVALUATION COMPLEXITY: Low      PLAN: OT FREQUENCY: 1-2x/week  OT DURATION: 4 weeks  PLANNED INTERVENTIONS: self care/ADL training, therapeutic exercise, therapeutic activity, manual therapy, passive range of motion, aquatic therapy, electrical stimulation, ultrasound, moist heat, cryotherapy, patient/family education, energy conservation, and DME and/or AE instructions  RECOMMENDED OTHER SERVICES: NA  CONSULTED AND AGREED WITH PLAN OF CARE: Patient  PLAN FOR NEXT SESSION: review HEP, add to HEP as appropriate, engage in functional reach activities as able   [place], [facility], OTR/L 12/23/2021, 4:17 PM

## 2021-12-25 DIAGNOSIS — Z23 Encounter for immunization: Secondary | ICD-10-CM | POA: Diagnosis not present

## 2021-12-29 ENCOUNTER — Ambulatory Visit: Payer: Medicare Other | Admitting: Occupational Therapy

## 2021-12-29 DIAGNOSIS — R278 Other lack of coordination: Secondary | ICD-10-CM

## 2021-12-29 DIAGNOSIS — M79602 Pain in left arm: Secondary | ICD-10-CM | POA: Diagnosis not present

## 2021-12-29 DIAGNOSIS — M79642 Pain in left hand: Secondary | ICD-10-CM

## 2021-12-29 DIAGNOSIS — M6281 Muscle weakness (generalized): Secondary | ICD-10-CM | POA: Diagnosis not present

## 2021-12-29 NOTE — Therapy (Signed)
OUTPATIENT OCCUPATIONAL THERAPY ORTHO  Treatment Note  Patient Name: Latasha Young MRN: [id number] DOB:1950/05/21, 71 y.o., female Today's Date: 12/29/2021  PCP: Audley Hose, MD REFERRING PROVIDER: Audley Hose, MD    OT End of Session - 12/29/21 1025     Visit Number 3    Number of Visits 9    Date for OT Re-Evaluation 01/08/22    Authorization Type BCBS Medicare    OT Start Time 7824    OT Stop Time 1103    OT Time Calculation (min) 40 min               Past Medical History:  Diagnosis Date   Allergy    Cataract    "beginnings of"   GERD (gastroesophageal reflux disease)    Hyperlipidemia    Hypertension    Thyroid disease    2 benign nodules noted- check yearly   Vitamin D deficiency    Past Surgical History:  Procedure Laterality Date   ABDOMINAL HYSTERECTOMY     SIGMOIDOSCOPY     WISDOM TOOTH EXTRACTION     Patient Active Problem List   Diagnosis Date Noted   Hoarseness 03/11/2021   Atherosclerosis of aorta ([place]) 01/17/2020   Prediabetes 10/17/2018   Abnormal facial hair 10/12/2017   Multinodular goiter 12/23/2016   GERD (gastroesophageal reflux disease) 07/30/2016   Allergic rhinitis 07/30/2016   Hypertension, essential, benign 05/24/2016   Hyperlipidemia 05/24/2016   Class 1 obesity with body mass index (BMI) of 31.0 to 31.9 in adult 05/24/2016    ONSET DATE: ~11/19/21  REFERRING DIAG: M35.361 (ICD-10-CM) - Pain in left arm   THERAPY DIAG:  Muscle weakness (generalized)  Pain in left arm  Pain in left hand  Other lack of coordination  Rationale for Evaluation and Treatment Rehabilitation  SUBJECTIVE:   SUBJECTIVE STATEMENT: Pt reports that she still feels really stiff and has questions about technique for some of her exercises. Pt accompanied by: self  PERTINENT HISTORY: N/A  PRECAUTIONS: Fall  WEIGHT BEARING RESTRICTIONS No  PAIN:  Are you having pain? No  FALLS: Has patient fallen in last 6 months? Yes.  Number of falls 1 when in [place] she fell going up some steps  LIVING ENVIRONMENT: Lives with: lives with an adult companion Lives in: House/apartment Stairs: Yes: External: 3 steps; on right going up Has following equipment at home: bed side commode  PLOF: Independent, Independent with basic ADLs, Independent with household mobility without device, Independent with community mobility without device, and Independent with homemaking with ambulation  PATIENT GOALS to regain complete use of L hand/arm  OBJECTIVE:   HAND DOMINANCE: Right  ADLs: Transfers/ambulation related to ADLs: Mod I, reports that she does take her time especially when getting in/out of tubshower Grooming: Increased time, painful when attempting to style hair UB Dressing: occasional assist to manage buttons, reports "I am managing" LB Dressing: mild pain when pulling pants up/down against resistance Tub Shower transfers: Mod I Equipment: bed side commode  FUNCTIONAL OUTCOME MEASURES: Quick Dash: 43%  UPPER EXTREMITY ROM     ROM Right eval Left AROM eval Left PROM eval  Shoulder flexion WNL 114 122  Shoulder abduction     Shoulder adduction     Shoulder extension     Shoulder internal rotation     Shoulder external rotation     Elbow flexion     Elbow extension     Wrist flexion  55   Wrist extension  50  Wrist ulnar deviation     Wrist radial deviation     Wrist pronation     Wrist supination     (Blank rows = not tested)    UPPER EXTREMITY MMT:     MMT Right eval Left eval  Shoulder flexion  4/5  Shoulder abduction    Shoulder adduction    Shoulder extension    Shoulder internal rotation    Shoulder external rotation    Middle trapezius    Lower trapezius    Elbow flexion  4/5  Elbow extension  4/5  Wrist flexion    Wrist extension    Wrist ulnar deviation    Wrist radial deviation    Wrist pronation    Wrist supination    (Blank rows = not tested)  COORDINATION: Box and  Blocks:  Right 45 blocks, Left 35 blocks  SENSATION: Reports numbness in L hand  EDEMA: mild edema on volar side of hand  COGNITION: Overall cognitive status: Within functional limits for tasks assessed  OBSERVATIONS: guarding of LUE during all tasks during eval and ambulation in/out of treatment space  ----------------------------------------------------------------------------------------------------------------------------------------------------------------------  TODAY'S TREATMENT:  ADL: reports improvements with being able to manage buttons and zippers on clothing.  Reports able to open pop tops on containers and even cleaned her bathroom the other day with improved endurance and functional use of LUE. Scapula/Shoulder AROM: Reviewed scapular retraction, shoulder circles, and shoulder "W" with and without external rotation.  Pt initially overextending during "W" exercises.  Therapist providing tactile cues and demonstration with pt placing hands on therapist's scapulae to feel appropriate movement.  Pt then with improved return demonstration. Internal rotation: Seated Lifting Hands Behind Back with BUE.  Pt demonstrating ability to reach BUE together at lower back, however with limited ROM when attempting to raise hands up along spine.      12/23/21 Cervical stretches: Reviewed Seated Neck Sidebending Stretch, Gentle Levator Scapulae Stretch, Seated Cervical Rotation AROM .  Pt completed each stretch x5 with reports of mild stretch but no increase in pain.  Pt demonstrating good carryover of each stretch. Supine stretches: Engaged in supine lower trunk rotation with focus on full stretch up through UE.  Pt reports increased pain, therefore encouraged pt to lower arms with decrease in onset of pain.  Attempted PNF D1 and D2 reaching in supine, however pt with increased grimacing and pain therefore terminated activity. Scapula/shoulder ROM:  Attempted PNF D1 and D2 reaching in sitting,  however pt still with increased grimacing therefore aborted task.  OT directed pt in scapular retraction, shoulder circles, and shoulder "W" with and without external rotation.  Pt able to complete shoulder circles and scapular retraction with min cues for technique and slowed pace to ensure proper technique.  OT providing verbal cues, demonstration, and modeling with shoulder "W" and shoulder external rotation in 45* abduction.    PATIENT EDUCATION: Education details: education, demonstration, return demonstration with HEP Person educated: Patient Education method: Explanation, Demonstration, and Handouts Education comprehension: verbalized understanding and needs further education   HOME EXERCISE PROGRAM: Access Code: [email or web address] URL: [email or web address] Date: 12/23/2021 Prepared by: [facility]  Exercises - Seated Neck Sidebending Stretch  - 2 x daily - 2 sets - 10 reps - 10-20 hold - Gentle Levator Scapulae Stretch  - 2 x daily - 2 sets - 10 reps - 10-20 sec hold - Seated Cervical Rotation AROM  - 2 x daily - 2 sets - 10  reps - 10-20 hold - Seated Scapular Retraction  - 2 x daily - 2 sets - 10 reps - 10 hold - Seated Shoulder Shrug Circles AROM Backward/Forward  - 2 x daily - 2 sets - 10 reps - Seated Shoulder W  - 2 x daily - 2 sets - 10 reps - Shoulder External Rotation in 45 Degrees Abduction  - 2 x daily - 2 sets - 10 reps - Supine Lower Trunk Rotation  - 1 x daily - 2 sets - 10 reps   GOALS: Goals reviewed with patient? Yes   LONG TERM GOALS: Target date:  01/08/22     Pt will be Independent with HEP for LUE strengthening and ROM. Baseline:  Goal status: IN PROGRESS  2.  Pt will report ability to complete ADLs with increased ease, safety, and no increase in pain with use of AE/adaptive strategies PRN. Baseline:  Goal status: IN PROGRESS  3.  Pt will demonstrate increased shoulder flexion to 125* to remove/place light  weight objects at mod range. Baseline: 114* Goal status: IN PROGRESS  4.  Pt will demonstrate improved UE functional use for ADLs as evidenced by increasing box/ blocks score by 5 blocks with LUE Baseline: R: 45 and L: 35 Goal status: IN PROGRESS  5.  Pt will report improvements in functional use of LUE and decreased pain as evidenced by improved score on QuickDash to <30% by d/c. Baseline: 43% Goal status: IN PROGRESS   ASSESSMENT:  CLINICAL IMPRESSION: Treatment session with focus on review of HEP to facilitate increased carryover and functional return.  Pt reports improved functional use of LUE during ADLs/IADLs.  Pt benefiting from tactile cues and demonstration from therapist for increased understanding and carryover. Reiterated functional carryover of HEP and engagement in HEP to tolerance and discussed modifications as necessary if pain prohibiting movements.  PERFORMANCE DEFICITS in functional skills including ADLs, IADLs, coordination, dexterity, edema, ROM, strength, pain, [place], GMC, and UE functional use.  IMPAIRMENTS are limiting patient from ADLs and IADLs.   COMORBIDITIES may have co-morbidities  that affects occupational performance. Patient will benefit from skilled OT to address above impairments and improve overall function.  MODIFICATION OR ASSISTANCE TO COMPLETE EVALUATION: Min-Moderate modification of tasks or assist with assess necessary to complete an evaluation.  OT OCCUPATIONAL PROFILE AND HISTORY: Problem focused assessment: Including review of records relating to presenting problem.  CLINICAL DECISION MAKING: Moderate - several treatment options, min-mod task modification necessary  REHAB POTENTIAL: Good  EVALUATION COMPLEXITY: Low      PLAN: OT FREQUENCY: 1-2x/week  OT DURATION: 4 weeks  PLANNED INTERVENTIONS: self care/ADL training, therapeutic exercise, therapeutic activity, manual therapy, passive range of motion, aquatic therapy, electrical  stimulation, ultrasound, moist heat, cryotherapy, patient/family education, energy conservation, and DME and/or AE instructions  RECOMMENDED OTHER SERVICES: NA  CONSULTED AND AGREED WITH PLAN OF CARE: Patient  PLAN FOR NEXT SESSION: review HEP, add to HEP as appropriate, engage in functional reach activities as able   [place], [place], OTR/L 12/29/2021, 10:25 AM

## 2022-01-01 ENCOUNTER — Ambulatory Visit: Payer: Medicare Other | Admitting: Occupational Therapy

## 2022-01-01 DIAGNOSIS — M6281 Muscle weakness (generalized): Secondary | ICD-10-CM | POA: Diagnosis not present

## 2022-01-01 DIAGNOSIS — M79602 Pain in left arm: Secondary | ICD-10-CM | POA: Diagnosis not present

## 2022-01-01 DIAGNOSIS — M79642 Pain in left hand: Secondary | ICD-10-CM

## 2022-01-01 DIAGNOSIS — R278 Other lack of coordination: Secondary | ICD-10-CM

## 2022-01-01 NOTE — Patient Instructions (Signed)
  Bag Exercises:  Small trash bag or produce bag works best.  For all exercises, sit with big posture (sit up tall with head up) and use big movements. Perform the following exercises 1 times per day.  If pain increases >2/10 on pain scale stop.    Hold bag in both hands in front of you with hands/arms shoulder length apart. Move bag behind your head. Repeat 10 times. Hold bag in right hand. Move right hand to reach behind shoulder. Then, reach behind back with left hand to pass bag from right hand to left hand. Switch sides. Repeat 10 times on each side.

## 2022-01-01 NOTE — Therapy (Signed)
OUTPATIENT OCCUPATIONAL THERAPY ORTHO  Treatment Note  Patient Name: Latasha Young MRN: [id number] DOB:Oct 17, 1950, 71 y.o., female Today's Date: 01/01/2022  PCP: Audley Hose, MD REFERRING PROVIDER: Audley Hose, MD    OT End of Session - 01/01/22 1114     Visit Number 4    Number of Visits 9    Date for OT Re-Evaluation 01/08/22    Authorization Type BCBS Medicare    OT Start Time 1105    OT Stop Time 1145    OT Time Calculation (min) 40 min                Past Medical History:  Diagnosis Date   Allergy    Cataract    "beginnings of"   GERD (gastroesophageal reflux disease)    Hyperlipidemia    Hypertension    Thyroid disease    2 benign nodules noted- check yearly   Vitamin D deficiency    Past Surgical History:  Procedure Laterality Date   ABDOMINAL HYSTERECTOMY     SIGMOIDOSCOPY     WISDOM TOOTH EXTRACTION     Patient Active Problem List   Diagnosis Date Noted   Hoarseness 03/11/2021   Atherosclerosis of aorta ([place]) 01/17/2020   Prediabetes 10/17/2018   Abnormal facial hair 10/12/2017   Multinodular goiter 12/23/2016   GERD (gastroesophageal reflux disease) 07/30/2016   Allergic rhinitis 07/30/2016   Hypertension, essential, benign 05/24/2016   Hyperlipidemia 05/24/2016   Class 1 obesity with body mass index (BMI) of 31.0 to 31.9 in adult 05/24/2016    ONSET DATE: ~11/19/21  REFERRING DIAG: E42.353 (ICD-10-CM) - Pain in left arm   THERAPY DIAG:  Muscle weakness (generalized)  Pain in left arm  Pain in left hand  Other lack of coordination  Rationale for Evaluation and Treatment Rehabilitation  SUBJECTIVE:   SUBJECTIVE STATEMENT: Pt reports that she still feels really stiff and has questions about technique for some of her exercises. Pt accompanied by: self  PERTINENT HISTORY: N/A  PRECAUTIONS: Fall  WEIGHT BEARING RESTRICTIONS No  PAIN:  Are you having pain? Yes: NPRS scale: 3/10 Pain location: L shoulder Pain  description: sore Aggravating factors: laying down at night Relieving factors: heating pad  FALLS: Has patient fallen in last 6 months? Yes. Number of falls 1 when in [place] she fell going up some steps  LIVING ENVIRONMENT: Lives with: lives with an adult companion Lives in: House/apartment Stairs: Yes: External: 3 steps; on right going up Has following equipment at home: bed side commode  PLOF: Independent, Independent with basic ADLs, Independent with household mobility without device, Independent with community mobility without device, and Independent with homemaking with ambulation  PATIENT GOALS to regain complete use of L hand/arm  OBJECTIVE:   HAND DOMINANCE: Right  ADLs: Transfers/ambulation related to ADLs: Mod I, reports that she does take her time especially when getting in/out of tubshower Grooming: Increased time, painful when attempting to style hair UB Dressing: occasional assist to manage buttons, reports "I am managing" LB Dressing: mild pain when pulling pants up/down against resistance Tub Shower transfers: Mod I Equipment: bed side commode  FUNCTIONAL OUTCOME MEASURES: Quick Dash: 43%  UPPER EXTREMITY ROM     ROM Right eval Left AROM eval Left PROM eval  Shoulder flexion WNL 114 122  Shoulder abduction     Shoulder adduction     Shoulder extension     Shoulder internal rotation     Shoulder external rotation     Elbow  flexion     Elbow extension     Wrist flexion  55   Wrist extension  50   Wrist ulnar deviation     Wrist radial deviation     Wrist pronation     Wrist supination     (Blank rows = not tested)    UPPER EXTREMITY MMT:     MMT Right eval Left eval  Shoulder flexion  4/5  Shoulder abduction    Shoulder adduction    Shoulder extension    Shoulder internal rotation    Shoulder external rotation    Middle trapezius    Lower trapezius    Elbow flexion  4/5  Elbow extension  4/5  Wrist flexion    Wrist extension    Wrist  ulnar deviation    Wrist radial deviation    Wrist pronation    Wrist supination    (Blank rows = not tested)  COORDINATION: Box and Blocks:  Right 45 blocks, Left 35 blocks  SENSATION: Reports numbness in L hand  EDEMA: mild edema on volar side of hand  COGNITION: Overall cognitive status: Within functional limits for tasks assessed  OBSERVATIONS: guarding of LUE during all tasks during eval and ambulation in/out of treatment space  ----------------------------------------------------------------------------------------------------------------------------------------------------------------------  TODAY'S TREATMENT: 01/01/22 Internal rotation: Seated Lifting Hands Behind Back with BUE x5.  Pt demonstrating limited ROM when attempting to raise B hands up along spine.  Bag exercises: Engaged in moving bag with BUE behind head x10.  Progressed to reaching over shoulder with L and and reaching behind back with R and switching directions x10.  OT directing on technique with demonstration and verbal cues.  Pt demonstrating decreased internal rotation bilaterally. UE Ranger: Engaged in UE Ranger in standing with focus on increased shoulder flexion and reaching at 45* angle.  OT providing min cues for technique and to decrease shoulder hike during mobility. Wall slides: Shoulder flexion and diagonals on wall with focus on correct body posture and technique.  Pt requiring min cues for body posture to not arch back or overcompensate at back or shoulder.    12/29/21 ADL: reports improvements with being able to manage buttons and zippers on clothing.  Reports able to open pop tops on containers and even cleaned her bathroom the other day with improved endurance and functional use of LUE. Scapula/Shoulder AROM: Reviewed scapular retraction, shoulder circles, and shoulder "W" with and without external rotation.  Pt initially overextending during "W" exercises.  Therapist providing tactile cues and  demonstration with pt placing hands on therapist's scapulae to feel appropriate movement.  Pt then with improved return demonstration. Internal rotation: Seated Lifting Hands Behind Back with BUE.  Pt demonstrating ability to reach BUE together at lower back, however with limited ROM when attempting to raise hands up along spine.      12/23/21 Cervical stretches: Reviewed Seated Neck Sidebending Stretch, Gentle Levator Scapulae Stretch, Seated Cervical Rotation AROM .  Pt completed each stretch x5 with reports of mild stretch but no increase in pain.  Pt demonstrating good carryover of each stretch. Supine stretches: Engaged in supine lower trunk rotation with focus on full stretch up through UE.  Pt reports increased pain, therefore encouraged pt to lower arms with decrease in onset of pain.  Attempted PNF D1 and D2 reaching in supine, however pt with increased grimacing and pain therefore terminated activity. Scapula/shoulder ROM:  Attempted PNF D1 and D2 reaching in sitting, however pt still with increased grimacing therefore aborted task.  OT directed pt in scapular retraction, shoulder circles, and shoulder "W" with and without external rotation.  Pt able to complete shoulder circles and scapular retraction with min cues for technique and slowed pace to ensure proper technique.  OT providing verbal cues, demonstration, and modeling with shoulder "W" and shoulder external rotation in 45* abduction.    PATIENT EDUCATION: Education details: education, demonstration, return demonstration with HEP Person educated: Patient Education method: Explanation, Demonstration, and Handouts Education comprehension: verbalized understanding and needs further education   HOME EXERCISE PROGRAM: Access Code: [email or web address] URL: [email or web address] Date: 01/01/2022 Prepared by: [facility]  Exercises - Seated Neck Sidebending Stretch  - 2 x daily - 2 sets - 10  reps - 10-20 hold - Gentle Levator Scapulae Stretch  - 2 x daily - 2 sets - 10 reps - 10-20 sec hold - Seated Cervical Rotation AROM  - 2 x daily - 2 sets - 10 reps - 10-20 hold - Seated Scapular Retraction  - 2 x daily - 2 sets - 10 reps - 10 hold - Seated Shoulder Shrug Circles AROM Backward/Forward  - 2 x daily - 2 sets - 10 reps - Seated Shoulder W  - 2 x daily - 2 sets - 10 reps - Shoulder External Rotation in 45 Degrees Abduction  - 2 x daily - 2 sets - 10 reps - Shoulder Flexion Wall Slide with Towel  - 2 x daily - 2 sets - 10 reps - Supine Lower Trunk Rotation  - 1 x daily - 2 sets - 10 reps - Seated Lifting Hands Behind Back  - 1 x daily - 2 sets - 10 reps   GOALS: Goals reviewed with patient? Yes   LONG TERM GOALS: Target date:  01/08/22     Pt will be Independent with HEP for LUE strengthening and ROM. Baseline:  Goal status: IN PROGRESS  2.  Pt will report ability to complete ADLs with increased ease, safety, and no increase in pain with use of AE/adaptive strategies PRN. Baseline:  Goal status: IN PROGRESS  3.  Pt will demonstrate increased shoulder flexion to 125* to remove/place light weight objects at mod range. Baseline: 114* Goal status: IN PROGRESS  4.  Pt will demonstrate improved UE functional use for ADLs as evidenced by increasing box/ blocks score by 5 blocks with LUE Baseline: R: 45 and L: 35 Goal status: IN PROGRESS  5.  Pt will report improvements in functional use of LUE and decreased pain as evidenced by improved score on QuickDash to <30% by d/c. Baseline: 43% Goal status: IN PROGRESS   ASSESSMENT:  CLINICAL IMPRESSION: Treatment session with focus on review of HEP and addition of exercises to facilitate increased carryover and functional return.  Reiterated functional carryover of HEP and engagement in HEP to tolerance and discussed modifications as necessary if pain prohibiting movements.  PERFORMANCE DEFICITS in functional skills including  ADLs, IADLs, coordination, dexterity, edema, ROM, strength, pain, [place], [facility], and UE functional use.  IMPAIRMENTS are limiting patient from ADLs and IADLs.   COMORBIDITIES may have co-morbidities  that affects occupational performance. Patient will benefit from skilled OT to address above impairments and improve overall function.  MODIFICATION OR ASSISTANCE TO COMPLETE EVALUATION: Min-Moderate modification of tasks or assist with assess necessary to complete an evaluation.  OT OCCUPATIONAL PROFILE AND HISTORY: Problem focused assessment: Including review of records relating to presenting problem.  CLINICAL DECISION MAKING: Moderate - several treatment options, min-mod  task modification necessary  REHAB POTENTIAL: Good  EVALUATION COMPLEXITY: Low      PLAN: OT FREQUENCY: 1-2x/week  OT DURATION: 4 weeks  PLANNED INTERVENTIONS: self care/ADL training, therapeutic exercise, therapeutic activity, manual therapy, passive range of motion, aquatic therapy, electrical stimulation, ultrasound, moist heat, cryotherapy, patient/family education, energy conservation, and DME and/or AE instructions  RECOMMENDED OTHER SERVICES: NA  CONSULTED AND AGREED WITH PLAN OF CARE: Patient  PLAN FOR NEXT SESSION: review HEP, add to HEP as appropriate, engage in functional reach activities as able   [place], [place], OTR/L 01/01/2022, 11:14 AM

## 2022-01-04 ENCOUNTER — Other Ambulatory Visit: Payer: Self-pay | Admitting: Internal Medicine

## 2022-01-04 ENCOUNTER — Ambulatory Visit (HOSPITAL_BASED_OUTPATIENT_CLINIC_OR_DEPARTMENT_OTHER): Payer: Medicare Other | Admitting: Internal Medicine

## 2022-01-04 DIAGNOSIS — E785 Hyperlipidemia, unspecified: Secondary | ICD-10-CM | POA: Diagnosis not present

## 2022-01-04 DIAGNOSIS — L680 Hirsutism: Secondary | ICD-10-CM | POA: Diagnosis not present

## 2022-01-04 DIAGNOSIS — E041 Nontoxic single thyroid nodule: Secondary | ICD-10-CM

## 2022-01-04 DIAGNOSIS — I10 Essential (primary) hypertension: Secondary | ICD-10-CM | POA: Diagnosis not present

## 2022-01-04 DIAGNOSIS — G4733 Obstructive sleep apnea (adult) (pediatric): Secondary | ICD-10-CM

## 2022-01-05 ENCOUNTER — Ambulatory Visit
Admission: RE | Admit: 2022-01-05 | Discharge: 2022-01-05 | Disposition: A | Discharge status: Home / Self Care | Payer: Medicare Other | Source: Ambulatory Visit | Attending: Internal Medicine | Admitting: Internal Medicine

## 2022-01-05 DIAGNOSIS — E041 Nontoxic single thyroid nodule: Secondary | ICD-10-CM | POA: Diagnosis not present

## 2022-01-06 ENCOUNTER — Ambulatory Visit: Payer: Medicare Other | Attending: Internal Medicine | Admitting: Occupational Therapy

## 2022-01-06 ENCOUNTER — Encounter: Payer: Self-pay | Admitting: Occupational Therapy

## 2022-01-06 DIAGNOSIS — R278 Other lack of coordination: Secondary | ICD-10-CM | POA: Insufficient documentation

## 2022-01-06 DIAGNOSIS — M6281 Muscle weakness (generalized): Secondary | ICD-10-CM | POA: Insufficient documentation

## 2022-01-06 DIAGNOSIS — M79642 Pain in left hand: Secondary | ICD-10-CM | POA: Diagnosis not present

## 2022-01-06 DIAGNOSIS — M79602 Pain in left arm: Secondary | ICD-10-CM | POA: Diagnosis not present

## 2022-01-06 NOTE — Therapy (Signed)
OUTPATIENT OCCUPATIONAL THERAPY TREATMENT NOTE & DISCHARGE SUMMARY  Patient Name: Latasha Young MRN: [id number] DOB:01-18-1951, 71 y.o., female Today's Date: 01/06/2022  PCP: Audley Hose, MD REFERRING PROVIDER: Audley Hose, MD    OT End of Session - 01/06/22 1106     Visit Number 5    Number of Visits 9    Date for OT Re-Evaluation 01/08/22    Authorization Type BCBS Medicare    OT Start Time 1102    OT Stop Time 1127    OT Time Calculation (min) 25 min    Activity Tolerance Patient tolerated treatment well    Behavior During Therapy WFL for tasks assessed/performed            OCCUPATIONAL THERAPY DISCHARGE SUMMARY  Visits from Start of Care: 5  Current functional level related to goals / functional outcomes: Pt reports she is able to complete all functional tasks independently; see goals below for detail   Remaining deficits: None   Education / Equipment: Education complete - compensatory strategies, pt-specific HEP, condition-specific education   Patient agrees to discharge. Patient goals were partially met. Patient is being discharged due to the patient's request and being pleased w/ her current functional level.   Past Medical History:  Diagnosis Date   Allergy    Cataract    "beginnings of"   GERD (gastroesophageal reflux disease)    Hyperlipidemia    Hypertension    Thyroid disease    2 benign nodules noted- check yearly   Vitamin D deficiency    Past Surgical History:  Procedure Laterality Date   ABDOMINAL HYSTERECTOMY     SIGMOIDOSCOPY     WISDOM TOOTH EXTRACTION     Patient Active Problem List   Diagnosis Date Noted   Hoarseness 03/11/2021   Atherosclerosis of aorta ([place]) 01/17/2020   Prediabetes 10/17/2018   Abnormal facial hair 10/12/2017   Multinodular goiter 12/23/2016   GERD (gastroesophageal reflux disease) 07/30/2016   Allergic rhinitis 07/30/2016   Hypertension, essential, benign 05/24/2016   Hyperlipidemia  05/24/2016   Class 1 obesity with body mass index (BMI) of 31.0 to 31.9 in adult 05/24/2016    ONSET DATE: ~11/19/21  REFERRING DIAG: E33.295 (ICD-10-CM) - Pain in left arm   THERAPY DIAG:  Muscle weakness (generalized)  Pain in left arm  Pain in left hand  Other lack of coordination  Rationale for Evaluation and Treatment Rehabilitation  SUBJECTIVE:   SUBJECTIVE STATEMENT: Pt reports her arm has been feeling a lot better, she has gotten a lot of benefit from therapy, and would like for today to be her last session. Also reports she is "dealing with a medication change" related to her BP management and it is causing some stomach upset; is working on following up w/ her doctor. Pt accompanied by: self  PERTINENT HISTORY: L upper back and arm pain, suspected muscle strain/spasm to L trapezius s/p fall  PRECAUTIONS: Fall  PAIN: Are you having pain? No  FALLS: Has patient fallen in last 6 months? Yes. Number of falls 1 when in [place] she fell going up some steps  PLOF: Independent, Independent with basic ADLs, Independent with household mobility without device, Independent with community mobility without device, and Independent with homemaking with ambulation  PATIENT GOALS to regain complete use of L hand/arm   OBJECTIVE:   HAND DOMINANCE: Right   UPPER EXTREMITY ROM     ROM Right AROM Eval - 9/8 Left AROM Eval - 9/8 Left PROM Eval -  9/8 Left AROM D/C - 10/4  Shoulder flexion WNL 114 122 122  Wrist flexion  55    Wrist extension  50    (Blank rows = not tested)  UPPER EXTREMITY MMT:    LUE grossly 4/5 at time of eval on 12/11/21  --------------------------------------------------------------------------------------------------------------------------------------------------------  TODAY'S TREATMENT:  01/06/22 See patient education below   01/01/22 Internal rotation: Seated Lifting Hands Behind Back with BUE x5.  Pt demonstrating limited ROM when attempting to  raise B hands up along spine. Bag exercises: Engaged in moving bag with BUE behind head x10.  Progressed to reaching over shoulder with L and and reaching behind back with R and switching directions x10.  OT directing on technique with demonstration and verbal cues.  Pt demonstrating decreased internal rotation bilaterally. UE Ranger: Engaged in UE Ranger in standing with focus on increased shoulder flexion and reaching at 45* angle.  OT providing min cues for technique and to decrease shoulder hike during mobility. Wall slides: Shoulder flexion and diagonals on wall with focus on correct body posture and technique.  Pt requiring min cues for body posture to not arch back or overcompensate at back or shoulder.   12/29/21 ADL: reports improvements with being able to manage buttons and zippers on clothing.  Reports able to open pop tops on containers and even cleaned her bathroom the other day with improved endurance and functional use of LUE. Scapula/Shoulder AROM: Reviewed scapular retraction, shoulder circles, and shoulder "W" with and without external rotation.  Pt initially overextending during "W" exercises.  Therapist providing tactile cues and demonstration with pt placing hands on therapist's scapulae to feel appropriate movement.  Pt then with improved return demonstration. Internal rotation: Seated Lifting Hands Behind Back with BUE.  Pt demonstrating ability to reach BUE together at lower back, however with limited ROM when attempting to raise hands up along spine.     12/23/21 Cervical stretches: Reviewed Seated Neck Sidebending Stretch, Gentle Levator Scapulae Stretch, Seated Cervical Rotation AROM .  Pt completed each stretch x5 with reports of mild stretch but no increase in pain.  Pt demonstrating good carryover of each stretch. Supine stretches: Engaged in supine lower trunk rotation with focus on full stretch up through UE.  Pt reports increased pain, therefore encouraged pt to lower  arms with decrease in onset of pain.  Attempted PNF D1 and D2 reaching in supine, however pt with increased grimacing and pain therefore terminated activity. Scapula/shoulder ROM:  Attempted PNF D1 and D2 reaching in sitting, however pt still with increased grimacing therefore aborted task.  OT directed pt in scapular retraction, shoulder circles, and shoulder "W" with and without external rotation.  Pt able to complete shoulder circles and scapular retraction with min cues for technique and slowed pace to ensure proper technique.  OT providing verbal cues, demonstration, and modeling with shoulder "W" and shoulder external rotation in 45* abduction.     PATIENT EDUCATION: Ongoing condition-specific education, particularly related to review of previously discussed compensatory strategies, positional considerations, and other education, personalized HEP, and progress toward goals, including interpretation of objective measurement changes assessed today Person educated: Patient Education method: Explanation Education comprehension: verbalized understanding   HOME EXERCISE PROGRAM: Access Code: [email or web address] URL: [email or web address] Date: 01/01/2022 Prepared by: [facility]  Exercises - Seated Neck Sidebending Stretch  - 2 x daily - 2 sets - 10 reps - 10-20 hold - Gentle Levator Scapulae Stretch  - 2 x daily - 2  sets - 10 reps - 10-20 sec hold - Seated Cervical Rotation AROM  - 2 x daily - 2 sets - 10 reps - 10-20 hold - Seated Scapular Retraction  - 2 x daily - 2 sets - 10 reps - 10 hold - Seated Shoulder Shrug Circles AROM Backward/Forward  - 2 x daily - 2 sets - 10 reps - Seated Shoulder W  - 2 x daily - 2 sets - 10 reps - Shoulder External Rotation in 45 Degrees Abduction  - 2 x daily - 2 sets - 10 reps - Shoulder Flexion Wall Slide with Towel  - 2 x daily - 2 sets - 10 reps - Supine Lower Trunk Rotation  - 1 x daily - 2 sets - 10 reps -  Seated Lifting Hands Behind Back  - 1 x daily - 2 sets - 10 reps   GOALS: Goals reviewed with patient? Yes  LONG TERM GOALS: Target date:  01/08/22     Pt will be Independent with HEP for LUE strengthening and ROM. Baseline:  Goal status: MET - 01/06/22  2.  Pt will report ability to complete ADLs with increased ease, safety, and no increase in pain with use of AE/adaptive strategies PRN. Baseline:  Goal status: MET - 01/06/22  3.  Pt will demonstrate increased shoulder flexion to 125* to remove/place light weight objects at mod range. Baseline: 114* Goal status: PARTIALLY MET - 01/06/22: 122 deg  4.  Pt will demonstrate improved UE functional use for ADLs as evidenced by increasing box/ blocks score by 5 blocks with LUE Baseline: R: 45 and L: 35 Goal status: MET - 01/06/22: 48 blocks  5.  Pt will report improvements in functional use of LUE and decreased pain as evidenced by improved score on QuickDash to < 30% by d/c. Baseline: 43% Goal status: MET - 01/06/22: 0% (no functional impairment)   ASSESSMENT:  CLINICAL IMPRESSION: Mihika Surrette is a 71 y/o who has been seen in OP OT for L upper back and arm pain after falling while on a trip in [place]. Pt has shown improvements in all areas addressed in therapy w/ 4/5 functional goals met and 1 goal partially met; pt increased R shoulder flexion by 8 degrees, which is now [facility], but did not obtain 125 deg of flexion per stated goal. Pt is appropriate for d/c from skilled OT to HEP at this time, reports she is satisfied with progress, and is currently agreeable to discharge plan. Session today focused on review of all goals, current participation/concerns related to participation in functional activities,  previously administered education, and HEP exercises w/ pt verbalizing and/or demonstrating understanding as appropriate. Reiterated functional carryover and HEP maintenance for continued improvement, encouraging pt to call back or return to MD w/  any further functional concerns or new/worsening symptoms; pt verbalized understanding.   PLAN: OT FREQUENCY: 1-2x/week  OT DURATION: 4 weeks  PLANNED INTERVENTIONS: self care/ADL training, therapeutic exercise, therapeutic activity, manual therapy, passive range of motion, aquatic therapy, electrical stimulation, ultrasound, moist heat, cryotherapy, patient/family education, energy conservation, and DME and/or AE instructions  RECOMMENDED OTHER SERVICES: NA  CONSULTED AND AGREED WITH PLAN OF CARE: Patient  PLAN FOR NEXT SESSION: D/C   Kathrine Cords, OTR/L 01/06/2022, 11:07 AM

## 2022-01-08 ENCOUNTER — Ambulatory Visit: Payer: Medicare Other | Admitting: Occupational Therapy

## 2022-01-28 DIAGNOSIS — E041 Nontoxic single thyroid nodule: Secondary | ICD-10-CM | POA: Diagnosis not present

## 2022-01-28 DIAGNOSIS — E785 Hyperlipidemia, unspecified: Secondary | ICD-10-CM | POA: Diagnosis not present

## 2022-01-28 DIAGNOSIS — E559 Vitamin D deficiency, unspecified: Secondary | ICD-10-CM | POA: Diagnosis not present

## 2022-01-28 DIAGNOSIS — I10 Essential (primary) hypertension: Secondary | ICD-10-CM | POA: Diagnosis not present

## 2022-02-02 ENCOUNTER — Other Ambulatory Visit: Payer: Self-pay | Admitting: Internal Medicine

## 2022-02-02 DIAGNOSIS — K7689 Other specified diseases of liver: Secondary | ICD-10-CM | POA: Diagnosis not present

## 2022-02-08 ENCOUNTER — Ambulatory Visit
Admission: RE | Admit: 2022-02-08 | Discharge: 2022-02-08 | Disposition: A | Discharge status: Home / Self Care | Payer: Medicare Other | Source: Ambulatory Visit | Attending: Internal Medicine | Admitting: Internal Medicine

## 2022-02-08 DIAGNOSIS — R945 Abnormal results of liver function studies: Secondary | ICD-10-CM | POA: Diagnosis not present

## 2022-02-08 DIAGNOSIS — K828 Other specified diseases of gallbladder: Secondary | ICD-10-CM | POA: Diagnosis not present

## 2022-02-08 DIAGNOSIS — K760 Fatty (change of) liver, not elsewhere classified: Secondary | ICD-10-CM | POA: Diagnosis not present

## 2022-02-08 DIAGNOSIS — K7689 Other specified diseases of liver: Secondary | ICD-10-CM

## 2022-02-09 DIAGNOSIS — Z1239 Encounter for other screening for malignant neoplasm of breast: Secondary | ICD-10-CM | POA: Diagnosis not present

## 2022-02-09 DIAGNOSIS — Z1211 Encounter for screening for malignant neoplasm of colon: Secondary | ICD-10-CM | POA: Diagnosis not present

## 2022-02-09 DIAGNOSIS — Z0001 Encounter for general adult medical examination with abnormal findings: Secondary | ICD-10-CM | POA: Diagnosis not present

## 2022-02-09 DIAGNOSIS — Z13820 Encounter for screening for osteoporosis: Secondary | ICD-10-CM | POA: Diagnosis not present

## 2022-02-10 ENCOUNTER — Other Ambulatory Visit: Payer: Self-pay | Admitting: Internal Medicine

## 2022-02-10 DIAGNOSIS — Z13820 Encounter for screening for osteoporosis: Secondary | ICD-10-CM

## 2022-02-11 DIAGNOSIS — R945 Abnormal results of liver function studies: Secondary | ICD-10-CM | POA: Diagnosis not present

## 2022-02-11 DIAGNOSIS — I10 Essential (primary) hypertension: Secondary | ICD-10-CM | POA: Diagnosis not present

## 2022-02-11 DIAGNOSIS — E876 Hypokalemia: Secondary | ICD-10-CM | POA: Diagnosis not present

## 2022-02-15 DIAGNOSIS — R1033 Periumbilical pain: Secondary | ICD-10-CM | POA: Diagnosis not present

## 2022-02-15 DIAGNOSIS — K8020 Calculus of gallbladder without cholecystitis without obstruction: Secondary | ICD-10-CM | POA: Diagnosis not present

## 2022-02-15 DIAGNOSIS — R932 Abnormal findings on diagnostic imaging of liver and biliary tract: Secondary | ICD-10-CM | POA: Diagnosis not present

## 2022-02-23 ENCOUNTER — Other Ambulatory Visit (HOSPITAL_BASED_OUTPATIENT_CLINIC_OR_DEPARTMENT_OTHER): Payer: Self-pay | Admitting: Surgery

## 2022-02-23 ENCOUNTER — Ambulatory Visit (HOSPITAL_COMMUNITY)
Admission: RE | Admit: 2022-02-23 | Discharge: 2022-02-23 | Disposition: A | Discharge status: Home / Self Care | Payer: Medicare Other | Source: Ambulatory Visit | Attending: Surgery | Admitting: Surgery

## 2022-02-23 DIAGNOSIS — R1032 Left lower quadrant pain: Secondary | ICD-10-CM | POA: Insufficient documentation

## 2022-02-23 DIAGNOSIS — K3189 Other diseases of stomach and duodenum: Secondary | ICD-10-CM | POA: Diagnosis not present

## 2022-02-23 DIAGNOSIS — R7989 Other specified abnormal findings of blood chemistry: Secondary | ICD-10-CM | POA: Diagnosis not present

## 2022-02-23 DIAGNOSIS — R109 Unspecified abdominal pain: Secondary | ICD-10-CM | POA: Diagnosis not present

## 2022-02-23 DIAGNOSIS — R1030 Lower abdominal pain, unspecified: Secondary | ICD-10-CM | POA: Diagnosis not present

## 2022-02-23 MED ORDER — SODIUM CHLORIDE (PF) 0.9 % IJ SOLN
INTRAMUSCULAR | Status: AC
  Filled 2022-02-23: qty 50

## 2022-02-23 MED ORDER — IOHEXOL 300 MG/ML  SOLN
100.0000 mL | Freq: Once | INTRAMUSCULAR | Status: AC | PRN
  Administered 2022-02-23: 100 mL via INTRAVENOUS

## 2022-03-01 ENCOUNTER — Encounter (HOSPITAL_COMMUNITY)
Admission: RE | Disposition: A | Discharge status: Home / Self Care | Payer: Self-pay | Source: Ambulatory Visit | Attending: Surgery

## 2022-03-01 ENCOUNTER — Other Ambulatory Visit: Payer: Self-pay

## 2022-03-01 ENCOUNTER — Ambulatory Visit (HOSPITAL_COMMUNITY)
Admission: RE | Admit: 2022-03-01 | Discharge: 2022-03-01 | Disposition: A | Discharge status: Home / Self Care | Payer: Medicare Other | Source: Ambulatory Visit | Attending: Surgery | Admitting: Surgery

## 2022-03-01 ENCOUNTER — Encounter (HOSPITAL_COMMUNITY): Payer: Self-pay | Admitting: Surgery

## 2022-03-01 ENCOUNTER — Ambulatory Visit (HOSPITAL_BASED_OUTPATIENT_CLINIC_OR_DEPARTMENT_OTHER): Payer: Medicare Other | Admitting: Certified Registered"

## 2022-03-01 ENCOUNTER — Ambulatory Visit (HOSPITAL_COMMUNITY): Payer: Medicare Other | Admitting: Certified Registered"

## 2022-03-01 DIAGNOSIS — I10 Essential (primary) hypertension: Secondary | ICD-10-CM

## 2022-03-01 DIAGNOSIS — K8000 Calculus of gallbladder with acute cholecystitis without obstruction: Secondary | ICD-10-CM | POA: Diagnosis not present

## 2022-03-01 DIAGNOSIS — K219 Gastro-esophageal reflux disease without esophagitis: Secondary | ICD-10-CM | POA: Insufficient documentation

## 2022-03-01 DIAGNOSIS — K8012 Calculus of gallbladder with acute and chronic cholecystitis without obstruction: Secondary | ICD-10-CM | POA: Diagnosis not present

## 2022-03-01 DIAGNOSIS — N2889 Other specified disorders of kidney and ureter: Secondary | ICD-10-CM | POA: Insufficient documentation

## 2022-03-01 DIAGNOSIS — K82A1 Gangrene of gallbladder in cholecystitis: Secondary | ICD-10-CM | POA: Diagnosis not present

## 2022-03-01 HISTORY — PX: CHOLECYSTECTOMY: SHX55

## 2022-03-01 LAB — CBC
HCT: 39.7 % (ref 36.0–46.0)
Hemoglobin: 13.1 g/dL (ref 12.0–15.0)
MCH: 29.6 pg (ref 26.0–34.0)
MCHC: 33 g/dL (ref 30.0–36.0)
MCV: 89.6 fL (ref 80.0–100.0)
Platelets: 231 10*3/uL (ref 150–400)
RBC: 4.43 MIL/uL (ref 3.87–5.11)
RDW: 12.3 % (ref 11.5–15.5)
WBC: 4.5 10*3/uL (ref 4.0–10.5)
nRBC: 0 % (ref 0.0–0.2)

## 2022-03-01 LAB — BASIC METABOLIC PANEL
Anion gap: 9 (ref 5–15)
BUN: 16 mg/dL (ref 8–23)
CO2: 28 mmol/L (ref 22–32)
Calcium: 9 mg/dL (ref 8.9–10.3)
Chloride: 100 mmol/L (ref 98–111)
Creatinine, Ser: 1.17 mg/dL — ABNORMAL HIGH (ref 0.44–1.00)
GFR, Estimated: 49 mL/min — ABNORMAL LOW (ref >60)
Glucose, Bld: 106 mg/dL — ABNORMAL HIGH (ref 70–99)
Potassium: 3 mmol/L — ABNORMAL LOW (ref 3.5–5.1)
Sodium: 137 mmol/L (ref 135–145)

## 2022-03-01 SURGERY — LAPAROSCOPIC CHOLECYSTECTOMY
Anesthesia: General

## 2022-03-01 MED ORDER — FENTANYL CITRATE PF 50 MCG/ML IJ SOSY
25.0000 ug | PREFILLED_SYRINGE | INTRAMUSCULAR | Status: DC | PRN
  Administered 2022-03-01: 50 ug via INTRAVENOUS
  Administered 2022-03-01: 25 ug via INTRAVENOUS

## 2022-03-01 MED ORDER — ORAL CARE MOUTH RINSE: 15.0000 mL | Freq: Once | OROMUCOSAL | Status: DC

## 2022-03-01 MED ORDER — PROPOFOL 10 MG/ML IV BOLUS
INTRAVENOUS | Status: AC
  Filled 2022-03-01: qty 20

## 2022-03-01 MED ORDER — FENTANYL CITRATE PF 50 MCG/ML IJ SOSY: 25.0000 ug | PREFILLED_SYRINGE | INTRAMUSCULAR | Status: DC | PRN

## 2022-03-01 MED ORDER — CHLORHEXIDINE GLUCONATE 0.12 % MT SOLN: 15.0000 mL | Freq: Once | OROMUCOSAL | Status: DC

## 2022-03-01 MED ORDER — BUPIVACAINE-EPINEPHRINE 0.5% -1:200000 IJ SOLN
INTRAMUSCULAR | Status: DC | PRN
  Administered 2022-03-01: 10 mL

## 2022-03-01 MED ORDER — LIDOCAINE 2% (20 MG/ML) 5 ML SYRINGE
INTRAMUSCULAR | Status: DC | PRN
  Administered 2022-03-01: 80 mg via INTRAVENOUS

## 2022-03-01 MED ORDER — BUPIVACAINE-EPINEPHRINE (PF) 0.5% -1:200000 IJ SOLN
INTRAMUSCULAR | Status: AC
  Filled 2022-03-01: qty 30

## 2022-03-01 MED ORDER — FENTANYL CITRATE PF 50 MCG/ML IJ SOSY
PREFILLED_SYRINGE | INTRAMUSCULAR | Status: AC
  Filled 2022-03-01: qty 1

## 2022-03-01 MED ORDER — LACTATED RINGERS IV SOLN: INTRAVENOUS | Status: DC

## 2022-03-01 MED ORDER — ONDANSETRON HCL 4 MG/2ML IJ SOLN
INTRAMUSCULAR | Status: AC
  Filled 2022-03-01: qty 2

## 2022-03-01 MED ORDER — PROPOFOL 10 MG/ML IV BOLUS
INTRAVENOUS | Status: DC | PRN
  Administered 2022-03-01: 140 mg via INTRAVENOUS

## 2022-03-01 MED ORDER — HYDROCODONE-ACETAMINOPHEN 5-325 MG PO TABS: 1.0000 | ORAL_TABLET | Freq: Four times a day (QID) | ORAL | 0 refills | Status: DC | PRN

## 2022-03-01 MED ORDER — FENTANYL CITRATE (PF) 100 MCG/2ML IJ SOLN
INTRAMUSCULAR | Status: DC | PRN
  Administered 2022-03-01 (×2): 50 ug via INTRAVENOUS

## 2022-03-01 MED ORDER — CHLORHEXIDINE GLUCONATE CLOTH 2 % EX PADS: 6.0000 | MEDICATED_PAD | Freq: Once | CUTANEOUS | Status: DC

## 2022-03-01 MED ORDER — 0.9 % SODIUM CHLORIDE (POUR BTL) OPTIME
TOPICAL | Status: DC | PRN
  Administered 2022-03-01: 1000 mL

## 2022-03-01 MED ORDER — LIDOCAINE HCL (PF) 2 % IJ SOLN
INTRAMUSCULAR | Status: AC
  Filled 2022-03-01: qty 5

## 2022-03-01 MED ORDER — DEXAMETHASONE SODIUM PHOSPHATE 10 MG/ML IJ SOLN
INTRAMUSCULAR | Status: AC
  Filled 2022-03-01: qty 1

## 2022-03-01 MED ORDER — POTASSIUM CHLORIDE CRYS ER 20 MEQ PO TBCR: 40.0000 meq | EXTENDED_RELEASE_TABLET | Freq: Two times a day (BID) | ORAL | 0 refills | Status: DC

## 2022-03-01 MED ORDER — LACTATED RINGERS IV SOLN
INTRAVENOUS | Status: DC | PRN
  Administered 2022-03-01: 1000 mL

## 2022-03-01 MED ORDER — FENTANYL CITRATE PF 50 MCG/ML IJ SOSY
PREFILLED_SYRINGE | INTRAMUSCULAR | Status: AC
  Administered 2022-03-01: 25 ug via INTRAVENOUS
  Filled 2022-03-01: qty 1

## 2022-03-01 MED ORDER — ACETAMINOPHEN 500 MG PO TABS
1000.0000 mg | ORAL_TABLET | ORAL | Status: AC
  Administered 2022-03-01: 1000 mg via ORAL
  Filled 2022-03-01: qty 2

## 2022-03-01 MED ORDER — CHLORHEXIDINE GLUCONATE CLOTH 2 % EX PADS
6.0000 | MEDICATED_PAD | Freq: Once | CUTANEOUS | Status: AC
  Administered 2022-03-01: 6 via TOPICAL

## 2022-03-01 MED ORDER — CEFAZOLIN SODIUM-DEXTROSE 2-4 GM/100ML-% IV SOLN
2.0000 g | INTRAVENOUS | Status: AC
  Administered 2022-03-01: 2 g via INTRAVENOUS
  Filled 2022-03-01: qty 100

## 2022-03-01 MED ORDER — ROCURONIUM BROMIDE 10 MG/ML (PF) SYRINGE
PREFILLED_SYRINGE | INTRAVENOUS | Status: DC | PRN
  Administered 2022-03-01: 50 mg via INTRAVENOUS

## 2022-03-01 MED ORDER — CHLORHEXIDINE GLUCONATE 0.12 % MT SOLN
15.0000 mL | Freq: Once | OROMUCOSAL | Status: AC
  Administered 2022-03-01: 15 mL via OROMUCOSAL

## 2022-03-01 MED ORDER — EPHEDRINE 5 MG/ML INJ
INTRAVENOUS | Status: AC
  Filled 2022-03-01: qty 5

## 2022-03-01 MED ORDER — FENTANYL CITRATE (PF) 100 MCG/2ML IJ SOLN
INTRAMUSCULAR | Status: AC
  Filled 2022-03-01: qty 2

## 2022-03-01 SURGICAL SUPPLY — 44 items
APPLIER CLIP 5 13 M/L LIGAMAX5 (MISCELLANEOUS) ×1
BAG COUNTER SPONGE SURGICOUNT (BAG) IMPLANT
CHLORAPREP W/TINT 26 (MISCELLANEOUS) ×1 IMPLANT
CLIP APPLIE 5 13 M/L LIGAMAX5 (MISCELLANEOUS) ×1 IMPLANT
COVER MAYO STAND XLG (MISCELLANEOUS) ×1 IMPLANT
COVER SURGICAL LIGHT HANDLE (MISCELLANEOUS) ×1 IMPLANT
DERMABOND ADVANCED .7 DNX12 (GAUZE/BANDAGES/DRESSINGS) ×1 IMPLANT
DRAPE C-ARM 42X120 X-RAY (DRAPES) IMPLANT
ELECT L-HOOK LAP 45CM DISP (ELECTROSURGICAL)
ELECT PENCIL ROCKER SW 15FT (MISCELLANEOUS) ×1 IMPLANT
ELECT REM PT RETURN 15FT ADLT (MISCELLANEOUS) ×1 IMPLANT
ELECTRODE L-HOOK LAP 45CM DISP (ELECTROSURGICAL) IMPLANT
GLOVE BIOGEL PI IND STRL 6 (GLOVE) ×1 IMPLANT
GLOVE BIOGEL PI MICRO STRL 5.5 (GLOVE) ×1 IMPLANT
GOWN STRL REUS W/ TWL LRG LVL3 (GOWN DISPOSABLE) ×1 IMPLANT
GOWN STRL REUS W/TWL LRG LVL3 (GOWN DISPOSABLE) ×1
GRASPER SUT TROCAR 14GX15 (MISCELLANEOUS) IMPLANT
HEMOSTAT SNOW SURGICEL 2X4 (HEMOSTASIS) IMPLANT
IRRIG SUCT STRYKERFLOW 2 WTIP (MISCELLANEOUS) ×1
IRRIGATION SUCT STRKRFLW 2 WTP (MISCELLANEOUS) ×1 IMPLANT
KIT BASIN OR (CUSTOM PROCEDURE TRAY) ×1 IMPLANT
KIT TURNOVER KIT A (KITS) IMPLANT
L-HOOK LAP DISP 36CM (ELECTROSURGICAL) ×1
LHOOK LAP DISP 36CM (ELECTROSURGICAL) ×1 IMPLANT
NDL INSUFFLATION 14GA 120MM (NEEDLE) IMPLANT
NEEDLE INSUFFLATION 14GA 120MM (NEEDLE) IMPLANT
POUCH RETRIEVAL ECOSAC 10 (ENDOMECHANICALS) IMPLANT
POUCH RETRIEVAL ECOSAC 10MM (ENDOMECHANICALS) ×1
SCISSORS LAP 5X35 DISP (ENDOMECHANICALS) ×1 IMPLANT
SET CHOLANGIOGRAPH MIX (MISCELLANEOUS) IMPLANT
SET TUBE SMOKE EVAC HIGH FLOW (TUBING) ×1 IMPLANT
SLEEVE Z-THREAD 5X100MM (TROCAR) ×2 IMPLANT
SPIKE FLUID TRANSFER (MISCELLANEOUS) ×1 IMPLANT
SUT MNCRL AB 4-0 PS2 18 (SUTURE) ×1 IMPLANT
SUT VICRYL 0 UR6 27IN ABS (SUTURE) IMPLANT
SYS BAG RETRIEVAL 10MM (BASKET)
SYSTEM BAG RETRIEVAL 10MM (BASKET) IMPLANT
TOWEL OR 17X26 10 PK STRL BLUE (TOWEL DISPOSABLE) ×1 IMPLANT
TOWEL OR NON WOVEN STRL DISP B (DISPOSABLE) IMPLANT
TRAY LAPAROSCOPIC (CUSTOM PROCEDURE TRAY) ×1 IMPLANT
TROCAR ADV FIXATION 12X100MM (TROCAR) IMPLANT
TROCAR BALLN 12MMX100 BLUNT (TROCAR) IMPLANT
TROCAR XCEL BLUNT TIP 100MML (ENDOMECHANICALS) IMPLANT
TROCAR Z-THREAD OPTICAL 5X100M (TROCAR) ×1 IMPLANT

## 2022-03-01 NOTE — Discharge Instructions (Addendum)
[facility] DISCHARGE INSTRUCTIONS  Activity No heavy lifting greater than 15 pounds for 4 weeks after surgery. Ok to shower in 24 hours, but do not bathe or submerge incisions underwater. Do not drive while taking narcotic pain medication.  Wound Care Your incisions are covered with skin glue called Dermabond. This will peel off on its own over time. You may shower and allow warm soapy water to run over your incisions. Gently pat dry. Do not submerge your incision underwater. Monitor your incision for any new redness, tenderness, or drainage.  When to Call us: Fever greater than 100.5 New redness, drainage, or swelling at incision site Severe pain, nausea, or vomiting Jaundice (yellowing of the whites of the eyes or skin)  Medications You have been prescribed a medication to help with pain, called Norco (hydrocodone-acetaminophen). You may take this up to every 6 hours as needed for severe pain. You may also take Tylenol as needed for pain, but do not take any more than 500mg  every 6 hours. Norco also contains 325mg  of Tylenol in each tablet. Do NOT take more then a total of 4000mg  Tylenol in a single 24-hour period. Your potassium was low today. You have been prescribed potassium tablets to help with this. Please take the tablets as instructed until you finish them.  Follow-up You have an appointment scheduled with Dr. on March 23, 2022 at 10:50am. This will be at the [facility] office at [address], [address], [place], [facility]. Please arrive at least 15 minutes prior to your scheduled appointment time.  For questions or concerns, please call the office at [phone].

## 2022-03-01 NOTE — Anesthesia Preprocedure Evaluation (Signed)
Anesthesia Evaluation  Patient identified by MRN, date of birth, ID band Patient awake    Reviewed: Allergy & Precautions, NPO status , Patient's Chart, lab work & pertinent test results  Airway Mallampati: II       Dental   Pulmonary neg pulmonary ROS   breath sounds clear to auscultation       Cardiovascular hypertension,  Rhythm:Regular Rate:Normal     Neuro/Psych negative neurological ROS     GI/Hepatic Neg liver ROS,GERD  ,,History noted Dr. Chilton Si   Endo/Other  negative endocrine ROS    Renal/GU negative Renal ROS     Musculoskeletal   Abdominal   Peds  Hematology   Anesthesia Other Findings   Reproductive/Obstetrics                             Anesthesia Physical Anesthesia Plan  ASA: 3  Anesthesia Plan: General   Post-op Pain Management: Tylenol PO (pre-op)*   Induction: Intravenous  PONV Risk Score and Plan: 3 and Ondansetron, Dexamethasone and Midazolam  Airway Management Planned: Oral ETT  Additional Equipment:   Intra-op Plan:   Post-operative Plan: Extubation in OR  Informed Consent: I have reviewed the patients History and Physical, chart, labs and discussed the procedure including the risks, benefits and alternatives for the proposed anesthesia with the patient or authorized representative who has indicated his/her understanding and acceptance.     Dental advisory given  Plan Discussed with: CRNA and Anesthesiologist  Anesthesia Plan Comments:        Anesthesia Quick Evaluation

## 2022-03-01 NOTE — Progress Notes (Signed)
Verified with patient that she is to arrive to [facility] at 0915.  Patient was instructed to take her metoprolol this morning before arriving.  Also verified that patient has been NPO since [place]  [employer], Radio producer - Perioperative Services [facility] [phone]

## 2022-03-01 NOTE — Op Note (Signed)
Date: 03/01/22  Patient: Latasha Young MRN: [id number]  Preoperative Diagnosis: Symptomatic cholelithiasis Postoperative Diagnosis: Acute on chronic cholecystitis  Procedure: Laparoscopic cholecystectomy  Surgeon: Sophronia Simas, MD  EBL: Minimal  Anesthesia: General endotracheal  Specimens: Gallbladder   Indications: [name] is a 71 yo female who was referred after presenting with severe abdominal pain and a transient elevated in LFTs. She was found to have cholelithiasis. She has continued to have almost constant pain, and cholecystectomy was recommended. After a discussion of the risks and benefits of surgery, she agreed to proceed with cholecystectomy.  Findings: Cholelithiasis, gallbladder wall thickening with mild contraction. Purulent fluid within the gallbladder.  Procedure details: Informed consent was obtained in the preoperative area prior to the procedure. The patient was brought to the operating room and placed on the table in the supine position. General anesthesia was induced and appropriate lines and drains were placed for intraoperative monitoring. Perioperative antibiotics were administered per SCIP guidelines. The abdomen was prepped and draped in the usual sterile fashion. A pre-procedure timeout was taken verifying patient identity, surgical site and procedure to be performed.  A small infraumbilical skin incision was made, the subcutaneous tissue was divided with cautery, and the umbilical stalk was grasped and elevated. The fascia was incised and the peritoneal cavity was directly visualized. A 70mm Hassan trocar was placed and the abdomen was insufflated. The peritoneal cavity was inspected with no evidence of visceral or vascular injury. Three 74mm ports were placed in the right subcostal margin, all under direct visualization. The fundus of the gallbladder was grasped and retracted cephalad. There were thin omental adhesions to the gallbladder, which were taken down with  blunt dissection. The infundibulum was retracted laterally. There were long, thin filmy adhesions between the duodenum and infundibulum of the gallbladder, which were divided sharply. The infundibulum was retracted laterally, and the peritoneum over the gallbladder was opened with cautery. The cystic triangle was dissected out using cautery and blunt dissection, and the critical view of safety was obtained. Stones within the cystic duct were milked up into the gallbladder. The cystic duct and cystic artery were clipped and ligated, leaving two clips behind on the cystic duct stump. The gallbladder was taken off the liver using cautery, and the specimen was placed in an endocatch bag. There was purulent fluid within the gallbladder. The surgical site was irrigated with saline until the effluent was clear. The gallbladder fossa appeared hemostatic. The cystic duct and artery stumps were visually inspected and there was no evidence of bile leak or bleeding. The ports were removed under direct visualization and the abdomen was desufflated. The specimen was extracted via the umbilical port site. The umbilical port site fascia was closed with a 0 vicryl suture. The skin at all port sites was closed with 4-0 monocryl subcuticular suture. Dermabond was applied.  The patient tolerated the procedure well with no apparent complications. All counts were correct x2 at the end of the procedure. The patient was extubated and taken to PACU in stable condition.  Sophronia Simas, MD 03/01/22 12:21 PM

## 2022-03-01 NOTE — Anesthesia Postprocedure Evaluation (Signed)
Anesthesia Post Note  Patient: Latasha Young  Procedure(s) Performed: LAPAROSCOPIC CHOLECYSTECTOMY     Patient location during evaluation: PACU Anesthesia Type: General Level of consciousness: awake Pain management: pain level controlled Vital Signs Assessment: post-procedure vital signs reviewed and stable Respiratory status: spontaneous breathing Cardiovascular status: stable Postop Assessment: no apparent nausea or vomiting Anesthetic complications: no   No notable events documented.  Last Vitals:  Vitals:   03/01/22 1230 03/01/22 1245  BP: (!) 149/87 (!) 144/84  Pulse: 69 66  Resp: 17 10  Temp:    SpO2: 100% 97%    Last Pain:  Vitals:   03/01/22 1219  TempSrc:   PainSc: Asleep                 Seleena Reimers

## 2022-03-01 NOTE — Transfer of Care (Signed)
Immediate Anesthesia Transfer of Care Note  Patient: Latasha Young  Procedure(s) Performed: LAPAROSCOPIC CHOLECYSTECTOMY  Patient Location: PACU  Anesthesia Type:General  Level of Consciousness: awake, alert , and oriented  Airway & Oxygen Therapy: Patient Spontanous Breathing and Patient connected to face mask oxygen  Post-op Assessment: Report given to RN and Post -op Vital signs reviewed and stable  Post vital signs: Reviewed and stable  Last Vitals:  Vitals Value Taken Time  BP    Temp    Pulse    Resp 6 03/01/22 1219  SpO2    Vitals shown include unvalidated device data.  Last Pain:  Vitals:   03/01/22 1007  TempSrc: Oral  PainSc: 6          Complications: No notable events documented.

## 2022-03-01 NOTE — H&P (Signed)
Latasha Young is an 71 y.o. female.    HPI: Latasha Young is a 71 y.o. female who was referred to me for evaluation of gallstones. In mid October, she developed acute abdominal pain while visiting [facility]. She also had multiple episodes of vomiting and was unable to keep any food down. She describes the pain as very sharp and in the center of the abdomen, with radiation to the left side. She initially thought she had food poisoning. She reports she had a similar episode of pain several years ago but it was less severe. She has not had any symptoms in the interim until now. After returning from her trip she was seen by her PCP and noted to have elevated LFTs on 10/26 (Tbili 3.2, AST/ALT 184/198, alk phos 344). She was referred to GI (Dr. Benson Norway) and had a RUQ [place] on 11/6 showing sludge and likely stones within a decompressed gallbladder. She was referred to discuss surgery. She had repeat LFTs on 11/9 which were normal.  She was seen in the office last week and sent for a CT scan as some of her symptoms were not typical of biliary colic. This showed some gallbladder wall edema and an incidental renal mass. She is here today for cholecystectomy. She now says she has right sided and central abdominal pain.   Past Medical History:  Diagnosis Date   Allergy    Cataract    "beginnings of"   GERD (gastroesophageal reflux disease)    Hyperlipidemia    Hypertension    Thyroid disease    2 benign nodules noted- check yearly   Vitamin D deficiency     Past Surgical History:  Procedure Laterality Date   ABDOMINAL HYSTERECTOMY     SIGMOIDOSCOPY     WISDOM TOOTH EXTRACTION      Family History  Problem Relation Age of Onset   Stroke Mother    Hypertension Mother    Cancer Father    Lung cancer Father    Stroke Brother    Hypertension Maternal Grandmother    Hypertension Maternal Grandfather    Alcohol abuse Brother    Hypertension Brother    Stroke Brother    Cancer Sister    Goiter Sister     Colon cancer Neg Hx    Esophageal cancer Neg Hx    Rectal cancer Neg Hx    Stomach cancer Neg Hx    Social History:  reports that she has never smoked. She has never used smokeless tobacco. She reports current alcohol use. She reports that she does not use drugs.  Allergies:  Allergies  Allergen Reactions   Penicillins Hives    Medications Prior to Admission  Medication Sig Dispense Refill   Cholecalciferol (VITAMIN D3) 50000 units TABS Take by mouth once a week.     methocarbamol (ROBAXIN) 500 MG tablet Take 1 tablet (500 mg total) by mouth 2 (two) times daily as needed for muscle spasms. 10 tablet 0   metoprolol succinate (TOPROL-XL) 25 MG 24 hr tablet Take 1 tablet by mouth once daily 90 tablet 1   Multiple Vitamins-Minerals (MULTIVITAMIN ADULT PO) Take by mouth daily.     Omega-3 Fatty Acids (OMEGA-3 CF PO) Take by mouth.     amLODipine-benazepril (LOTREL) 5-20 MG capsule Take 1 capsule by mouth daily. (Patient not taking: Reported on 12/11/2021) 30 capsule 2   atorvastatin (LIPITOR) 40 MG tablet Take 1 tablet by mouth once daily 90 tablet 0   lidocaine (LIDODERM)  5 % Place 1 patch onto the skin daily. Remove & Discard patch within 12 hours or as directed by MD 5 patch 0   pantoprazole (PROTONIX) 40 MG tablet Take 1 tablet (40 mg total) by mouth daily. (Patient not taking: Reported on 12/11/2021) 30 tablet 3   triamterene-hydrochlorothiazide (DYAZIDE) 37.5-25 MG capsule Take 1 capsule by mouth daily.      Results for orders placed or performed during the hospital encounter of 03/01/22 (from the past 48 hour(s))  CBC per protocol     Status: None   Collection Time: 03/01/22 10:37 AM  Result Value Ref Range   WBC 4.5 4.0 - 10.5 K/uL   RBC 4.43 3.87 - 5.11 MIL/uL   Hemoglobin 13.1 12.0 - 15.0 g/dL   HCT 39.7 36.0 - 46.0 %   MCV 89.6 80.0 - 100.0 fL   MCH 29.6 26.0 - 34.0 pg   MCHC 33.0 30.0 - 36.0 g/dL   RDW 12.3 11.5 - 15.5 %   Platelets 231 150 - 400 K/uL   nRBC 0.0 0.0 - 0.2 %     Comment: Performed at [facility], [place] [address] [place], [place] [address]   No results found.  Review of Systems  Blood pressure (!) 144/88, pulse 66, temperature 98.5 F (36.9 C), temperature source Oral, resp. rate 16, height _0  (1.549 m), weight 70.3 kg, SpO2 99 %. Physical Exam Constitutional:      General: She is not in acute distress.    Appearance: Normal appearance.  HENT:     Head: Normocephalic and atraumatic.  Eyes:     General: No scleral icterus.    Conjunctiva/sclera: Conjunctivae normal.  Pulmonary:     Effort: Pulmonary effort is normal. No respiratory distress.  Abdominal:     General: There is no distension.     Palpations: Abdomen is soft.     Tenderness: There is no abdominal tenderness.  Skin:    General: Skin is warm and dry.  Neurological:     General: No focal deficit present.     Mental Status: She is alert and oriented to person, place, and time.      Assessment/Plan Ms. Igoe is a 71 yo female with abdominal pain and a transient elevation in LFTs, which has resolved. She has persistent abdominal pain. Proceed to OR for laparoscopic cholecystectomy. I discussed the surgery details including the benefits and risks of bile duct injury and post-cholecystectomy diarrhea. She agrees to proceed with surgery. Plan for discharge home postoperatively. She has already been referred to urology for her left renal mass.  Dwan Bolt, MD 03/01/2022, 10:56 AM

## 2022-03-01 NOTE — Anesthesia Procedure Notes (Signed)
Procedure Name: Intubation Date/Time: 03/01/2022 11:17 AM  Performed by: Reese Stockman D, CRNAPre-anesthesia Checklist: Patient identified, Emergency Drugs available, Suction available and Patient being monitored Patient Re-evaluated:Patient Re-evaluated prior to induction Oxygen Delivery Method: Circle system utilized Preoxygenation: Pre-oxygenation with 100% oxygen Induction Type: IV induction Ventilation: Mask ventilation without difficulty Laryngoscope Size: Mac and 3 Grade View: Grade I Tube type: Oral Tube size: 7.0 mm Number of attempts: 1 Airway Equipment and Method: Stylet and Oral airway Placement Confirmation: ETT inserted through vocal cords under direct vision, positive ETCO2 and breath sounds checked- equal and bilateral Secured at: 21 cm Tube secured with: Tape Dental Injury: Teeth and Oropharynx as per pre-operative assessment

## 2022-03-02 ENCOUNTER — Encounter (HOSPITAL_COMMUNITY): Payer: Self-pay | Admitting: Surgery

## 2022-03-02 LAB — SURGICAL PATHOLOGY

## 2022-03-05 ENCOUNTER — Encounter (HOSPITAL_BASED_OUTPATIENT_CLINIC_OR_DEPARTMENT_OTHER): Payer: Medicare Other | Admitting: Internal Medicine

## 2022-03-08 ENCOUNTER — Other Ambulatory Visit: Payer: Self-pay | Admitting: Surgery

## 2022-03-08 DIAGNOSIS — N2889 Other specified disorders of kidney and ureter: Secondary | ICD-10-CM

## 2022-03-11 ENCOUNTER — Ambulatory Visit: Payer: Medicare Other | Admitting: Endocrinology

## 2022-03-16 DIAGNOSIS — N390 Urinary tract infection, site not specified: Secondary | ICD-10-CM | POA: Diagnosis not present

## 2022-03-16 DIAGNOSIS — N183 Chronic kidney disease, stage 3 unspecified: Secondary | ICD-10-CM | POA: Diagnosis not present

## 2022-03-16 DIAGNOSIS — C649 Malignant neoplasm of unspecified kidney, except renal pelvis: Secondary | ICD-10-CM | POA: Diagnosis not present

## 2022-03-16 DIAGNOSIS — R945 Abnormal results of liver function studies: Secondary | ICD-10-CM | POA: Diagnosis not present

## 2022-03-16 DIAGNOSIS — I10 Essential (primary) hypertension: Secondary | ICD-10-CM | POA: Diagnosis not present

## 2022-03-31 DIAGNOSIS — N183 Chronic kidney disease, stage 3 unspecified: Secondary | ICD-10-CM | POA: Diagnosis not present

## 2022-03-31 DIAGNOSIS — C649 Malignant neoplasm of unspecified kidney, except renal pelvis: Secondary | ICD-10-CM | POA: Diagnosis not present

## 2022-03-31 DIAGNOSIS — R945 Abnormal results of liver function studies: Secondary | ICD-10-CM | POA: Diagnosis not present

## 2022-03-31 DIAGNOSIS — I10 Essential (primary) hypertension: Secondary | ICD-10-CM | POA: Diagnosis not present

## 2022-04-02 ENCOUNTER — Ambulatory Visit
Admission: RE | Admit: 2022-04-02 | Discharge: 2022-04-02 | Disposition: A | Discharge status: Home / Self Care | Payer: Medicare Other | Source: Ambulatory Visit | Attending: Surgery | Admitting: Surgery

## 2022-04-02 DIAGNOSIS — N2889 Other specified disorders of kidney and ureter: Secondary | ICD-10-CM

## 2022-04-02 DIAGNOSIS — K829 Disease of gallbladder, unspecified: Secondary | ICD-10-CM | POA: Diagnosis not present

## 2022-04-02 MED ORDER — GADOPICLENOL 0.5 MMOL/ML IV SOLN
7.0000 mL | Freq: Once | INTRAVENOUS | Status: AC | PRN
  Administered 2022-04-02: 7 mL via INTRAVENOUS

## 2022-04-08 ENCOUNTER — Other Ambulatory Visit: Payer: Self-pay | Admitting: Internal Medicine

## 2022-04-08 DIAGNOSIS — Z1231 Encounter for screening mammogram for malignant neoplasm of breast: Secondary | ICD-10-CM

## 2022-06-01 ENCOUNTER — Ambulatory Visit
Admission: RE | Admit: 2022-06-01 | Discharge: 2022-06-01 | Disposition: A | Discharge status: Home / Self Care | Payer: Medicare Other | Source: Ambulatory Visit | Attending: Internal Medicine | Admitting: Internal Medicine

## 2022-06-01 ENCOUNTER — Ambulatory Visit: Payer: Medicare Other

## 2022-06-01 DIAGNOSIS — Z1231 Encounter for screening mammogram for malignant neoplasm of breast: Secondary | ICD-10-CM

## 2022-06-11 DIAGNOSIS — E785 Hyperlipidemia, unspecified: Secondary | ICD-10-CM | POA: Diagnosis not present

## 2022-06-22 DIAGNOSIS — E876 Hypokalemia: Secondary | ICD-10-CM | POA: Diagnosis not present

## 2022-06-22 DIAGNOSIS — E785 Hyperlipidemia, unspecified: Secondary | ICD-10-CM | POA: Diagnosis not present

## 2022-06-22 DIAGNOSIS — N183 Chronic kidney disease, stage 3 unspecified: Secondary | ICD-10-CM | POA: Diagnosis not present

## 2022-06-22 DIAGNOSIS — I10 Essential (primary) hypertension: Secondary | ICD-10-CM | POA: Diagnosis not present

## 2022-08-31 DIAGNOSIS — N183 Chronic kidney disease, stage 3 unspecified: Secondary | ICD-10-CM | POA: Diagnosis not present

## 2022-09-09 DIAGNOSIS — R351 Nocturia: Secondary | ICD-10-CM | POA: Diagnosis not present

## 2022-09-09 DIAGNOSIS — I10 Essential (primary) hypertension: Secondary | ICD-10-CM | POA: Diagnosis not present

## 2022-09-09 DIAGNOSIS — N3281 Overactive bladder: Secondary | ICD-10-CM | POA: Diagnosis not present

## 2022-09-09 DIAGNOSIS — N1832 Chronic kidney disease, stage 3b: Secondary | ICD-10-CM | POA: Diagnosis not present

## 2022-09-10 DIAGNOSIS — H2513 Age-related nuclear cataract, bilateral: Secondary | ICD-10-CM | POA: Diagnosis not present

## 2022-09-17 ENCOUNTER — Ambulatory Visit
Admission: RE | Admit: 2022-09-17 | Discharge: 2022-09-17 | Disposition: A | Discharge status: Home / Self Care | Payer: Medicare Other | Source: Ambulatory Visit | Attending: Internal Medicine | Admitting: Internal Medicine

## 2022-09-17 ENCOUNTER — Other Ambulatory Visit: Payer: Self-pay | Admitting: Internal Medicine

## 2022-09-17 DIAGNOSIS — R109 Unspecified abdominal pain: Secondary | ICD-10-CM

## 2022-09-17 DIAGNOSIS — R079 Chest pain, unspecified: Secondary | ICD-10-CM | POA: Diagnosis not present

## 2022-09-20 ENCOUNTER — Other Ambulatory Visit: Payer: Self-pay | Admitting: Internal Medicine

## 2022-09-20 DIAGNOSIS — R197 Diarrhea, unspecified: Secondary | ICD-10-CM | POA: Diagnosis not present

## 2022-09-20 DIAGNOSIS — I10 Essential (primary) hypertension: Secondary | ICD-10-CM | POA: Diagnosis not present

## 2022-09-20 DIAGNOSIS — R109 Unspecified abdominal pain: Secondary | ICD-10-CM

## 2022-09-20 DIAGNOSIS — R1011 Right upper quadrant pain: Secondary | ICD-10-CM | POA: Diagnosis not present

## 2022-09-23 ENCOUNTER — Encounter: Payer: Self-pay | Admitting: Internal Medicine

## 2022-09-28 ENCOUNTER — Ambulatory Visit
Admission: RE | Admit: 2022-09-28 | Discharge: 2022-09-28 | Disposition: A | Discharge status: Home / Self Care | Payer: Medicare Other | Source: Ambulatory Visit | Attending: Internal Medicine | Admitting: Internal Medicine

## 2022-09-28 DIAGNOSIS — R1011 Right upper quadrant pain: Secondary | ICD-10-CM | POA: Diagnosis not present

## 2022-09-28 DIAGNOSIS — R109 Unspecified abdominal pain: Secondary | ICD-10-CM

## 2022-09-28 DIAGNOSIS — K7689 Other specified diseases of liver: Secondary | ICD-10-CM | POA: Diagnosis not present

## 2022-09-28 DIAGNOSIS — H0011 Chalazion right upper eyelid: Secondary | ICD-10-CM | POA: Diagnosis not present

## 2022-09-28 DIAGNOSIS — N289 Disorder of kidney and ureter, unspecified: Secondary | ICD-10-CM | POA: Diagnosis not present

## 2022-09-28 MED ORDER — IOPAMIDOL (ISOVUE-300) INJECTION 61%
100.0000 mL | Freq: Once | INTRAVENOUS | Status: AC | PRN
  Administered 2022-09-28: 80 mL via INTRAVENOUS

## 2022-10-04 DIAGNOSIS — R198 Other specified symptoms and signs involving the digestive system and abdomen: Secondary | ICD-10-CM | POA: Diagnosis not present

## 2022-10-04 DIAGNOSIS — R197 Diarrhea, unspecified: Secondary | ICD-10-CM | POA: Diagnosis not present

## 2022-10-04 DIAGNOSIS — I10 Essential (primary) hypertension: Secondary | ICD-10-CM | POA: Diagnosis not present

## 2022-10-04 DIAGNOSIS — R109 Unspecified abdominal pain: Secondary | ICD-10-CM | POA: Diagnosis not present

## 2022-10-15 DIAGNOSIS — H0011 Chalazion right upper eyelid: Secondary | ICD-10-CM | POA: Diagnosis not present

## 2022-10-18 DIAGNOSIS — Z86010 Personal history of colonic polyps: Secondary | ICD-10-CM | POA: Diagnosis not present

## 2022-10-18 DIAGNOSIS — R197 Diarrhea, unspecified: Secondary | ICD-10-CM | POA: Diagnosis not present

## 2022-10-18 DIAGNOSIS — R1031 Right lower quadrant pain: Secondary | ICD-10-CM | POA: Diagnosis not present

## 2022-11-09 DIAGNOSIS — N1832 Chronic kidney disease, stage 3b: Secondary | ICD-10-CM | POA: Diagnosis not present

## 2022-11-15 DIAGNOSIS — K080 Exfoliation of teeth due to systemic causes: Secondary | ICD-10-CM | POA: Diagnosis not present

## 2022-11-16 DIAGNOSIS — K080 Exfoliation of teeth due to systemic causes: Secondary | ICD-10-CM | POA: Diagnosis not present

## 2022-12-13 DIAGNOSIS — R1031 Right lower quadrant pain: Secondary | ICD-10-CM | POA: Diagnosis not present

## 2022-12-27 DIAGNOSIS — K529 Noninfective gastroenteritis and colitis, unspecified: Secondary | ICD-10-CM | POA: Diagnosis not present

## 2022-12-27 DIAGNOSIS — N3000 Acute cystitis without hematuria: Secondary | ICD-10-CM | POA: Diagnosis not present

## 2022-12-27 DIAGNOSIS — I10 Essential (primary) hypertension: Secondary | ICD-10-CM | POA: Diagnosis not present

## 2022-12-27 DIAGNOSIS — R197 Diarrhea, unspecified: Secondary | ICD-10-CM | POA: Diagnosis not present

## 2022-12-29 DIAGNOSIS — Z23 Encounter for immunization: Secondary | ICD-10-CM | POA: Diagnosis not present

## 2023-03-26 ENCOUNTER — Emergency Department (HOSPITAL_COMMUNITY): Payer: Medicare Other

## 2023-03-26 ENCOUNTER — Other Ambulatory Visit: Payer: Self-pay

## 2023-03-26 ENCOUNTER — Encounter (HOSPITAL_COMMUNITY): Payer: Self-pay

## 2023-03-26 ENCOUNTER — Ambulatory Visit (HOSPITAL_COMMUNITY)
Admission: EM | Admit: 2023-03-26 | Discharge: 2023-03-26 | Disposition: A | Discharge status: Home / Self Care | Payer: Medicare Other | Attending: Family Medicine | Admitting: Family Medicine

## 2023-03-26 ENCOUNTER — Emergency Department (HOSPITAL_COMMUNITY)
Admission: EM | Admit: 2023-03-26 | Discharge: 2023-03-26 | Disposition: A | Discharge status: Home / Self Care | Payer: Medicare Other | Attending: Emergency Medicine | Admitting: Emergency Medicine

## 2023-03-26 DIAGNOSIS — I10 Essential (primary) hypertension: Secondary | ICD-10-CM

## 2023-03-26 DIAGNOSIS — M79601 Pain in right arm: Secondary | ICD-10-CM | POA: Insufficient documentation

## 2023-03-26 DIAGNOSIS — I159 Secondary hypertension, unspecified: Secondary | ICD-10-CM | POA: Diagnosis not present

## 2023-03-26 DIAGNOSIS — R519 Headache, unspecified: Secondary | ICD-10-CM | POA: Diagnosis not present

## 2023-03-26 DIAGNOSIS — M4802 Spinal stenosis, cervical region: Secondary | ICD-10-CM | POA: Diagnosis not present

## 2023-03-26 DIAGNOSIS — Z79899 Other long term (current) drug therapy: Secondary | ICD-10-CM | POA: Insufficient documentation

## 2023-03-26 DIAGNOSIS — M5030 Other cervical disc degeneration, unspecified cervical region: Secondary | ICD-10-CM | POA: Diagnosis not present

## 2023-03-26 DIAGNOSIS — M79602 Pain in left arm: Secondary | ICD-10-CM

## 2023-03-26 LAB — COMPREHENSIVE METABOLIC PANEL
ALT: 15 U/L (ref 0–44)
AST: 22 U/L (ref 15–41)
Albumin: 3.7 g/dL (ref 3.5–5.0)
Alkaline Phosphatase: 74 U/L (ref 38–126)
Anion gap: 10 (ref 5–15)
BUN: 8 mg/dL (ref 8–23)
CO2: 24 mmol/L (ref 22–32)
Calcium: 8.9 mg/dL (ref 8.9–10.3)
Chloride: 102 mmol/L (ref 98–111)
Creatinine, Ser: 1.03 mg/dL — ABNORMAL HIGH (ref 0.44–1.00)
GFR, Estimated: 57 mL/min — ABNORMAL LOW (ref >60)
Glucose, Bld: 82 mg/dL (ref 70–99)
Potassium: 3.5 mmol/L (ref 3.5–5.1)
Sodium: 136 mmol/L (ref 135–145)
Total Bilirubin: 2.8 mg/dL — ABNORMAL HIGH (ref <1.2)
Total Protein: 6.5 g/dL (ref 6.5–8.1)

## 2023-03-26 LAB — URINALYSIS, ROUTINE W REFLEX MICROSCOPIC
Bacteria, UA: NONE SEEN
Bilirubin Urine: NEGATIVE
Glucose, UA: NEGATIVE mg/dL
Hgb urine dipstick: NEGATIVE
Ketones, ur: 5 mg/dL — AB
Leukocytes,Ua: NEGATIVE
Nitrite: NEGATIVE
Protein, ur: NEGATIVE mg/dL
Specific Gravity, Urine: 1.003 — ABNORMAL LOW (ref 1.005–1.030)
pH: 6 (ref 5.0–8.0)

## 2023-03-26 LAB — CBC
HCT: 41.9 % (ref 36.0–46.0)
Hemoglobin: 13.5 g/dL (ref 12.0–15.0)
MCH: 29.2 pg (ref 26.0–34.0)
MCHC: 32.2 g/dL (ref 30.0–36.0)
MCV: 90.5 fL (ref 80.0–100.0)
Platelets: 242 10*3/uL (ref 150–400)
RBC: 4.63 MIL/uL (ref 3.87–5.11)
RDW: 12.5 % (ref 11.5–15.5)
WBC: 4.4 10*3/uL (ref 4.0–10.5)
nRBC: 0 % (ref 0.0–0.2)

## 2023-03-26 LAB — TROPONIN I (HIGH SENSITIVITY): Troponin I (High Sensitivity): 3 ng/L (ref <18)

## 2023-03-26 LAB — LIPASE, BLOOD: Lipase: 27 U/L (ref 11–51)

## 2023-03-26 MED ORDER — METOCLOPRAMIDE HCL 5 MG/ML IJ SOLN
10.0000 mg | Freq: Once | INTRAMUSCULAR | Status: AC
  Administered 2023-03-26: 10 mg via INTRAVENOUS
  Filled 2023-03-26: qty 2

## 2023-03-26 MED ORDER — ASPIRIN 81 MG PO CHEW
CHEWABLE_TABLET | ORAL | Status: AC
  Filled 2023-03-26: qty 2

## 2023-03-26 MED ORDER — ASPIRIN 81 MG PO CHEW
162.0000 mg | CHEWABLE_TABLET | Freq: Once | ORAL | Status: AC
  Administered 2023-03-26: 162 mg via ORAL

## 2023-03-26 NOTE — ED Notes (Signed)
Report given to ED charge nurse Asher Muir RN by shyniece RN.

## 2023-03-26 NOTE — ED Provider Notes (Signed)
[facility]    CSN: [id number] Arrival date & time: 03/26/23  1218      History   Chief Complaint Chief Complaint  Patient presents with   Headache   Blood Pressure Check    HPI Latasha Young is a 72 y.o. female.   The history is provided by the patient. No language interpreter was used.  Headache Pain location:  Occipital and frontal (Frontal and occipital headaches radiating to the neck and left arm) Quality:  Dull Radiates to:  L neck (headaches radiating to the neck and left arm) Severity currently:  5/10 Severity at highest:  7/10 Onset quality:  Gradual Duration:  1 day Timing:  Constant Progression:  Waxing and waning Chronicity: Hx of Migraine which she has not had in years. Similar to prior headaches: no (She felt this headache is due to the fact tjat she did not sleep well last night)   Relieved by:  Nothing Worsened by:  Nothing Associated symptoms: neck pain and paresthesias   Associated symptoms: no ear pain, no eye pain, no fever, no hearing loss, no loss of balance, no nausea, no tingling, no visual change and no weakness   Associated symptoms comment:  She had diarrhea today with abdominal cramping. She is also recently had URI, but recovered well from that. She had a little tingling of her left hand and fingers this morning, but has since improved Hypertension This is a chronic problem. Episode onset: BP elevated and worsening x 2 weeks despite compliance with her home Losartan 100 mg QD and Metoprolol Xl 25 mg QD. She is not on Dyazide or Lotrel. Associated symptoms include headaches. Pertinent negatives include no chest pain and no shortness of breath. Associated symptoms comments: Left arm pain. Nothing aggravates the symptoms. Nothing relieves the symptoms.  Extremity Pain This is a new problem. Episode onset: few days, but persists. The problem occurs daily. The problem has not changed since onset.Associated symptoms include headaches.  Pertinent negatives include no chest pain and no shortness of breath. Nothing aggravates the symptoms. Nothing relieves the symptoms. She has tried nothing for the symptoms.       Past Medical History:  Diagnosis Date   Allergy    Cataract    "beginnings of"   GERD (gastroesophageal reflux disease)    Hyperlipidemia    Hypertension    Thyroid disease    2 benign nodules noted- check yearly   Vitamin D deficiency     Patient Active Problem List   Diagnosis Date Noted   Hoarseness 03/11/2021   Atherosclerosis of aorta (HCC) 01/17/2020   Prediabetes 10/17/2018   Abnormal facial hair 10/12/2017   Multinodular goiter 12/23/2016   GERD (gastroesophageal reflux disease) 07/30/2016   Allergic rhinitis 07/30/2016   Hypertension, essential, benign 05/24/2016   Hyperlipidemia 05/24/2016   Class 1 obesity with body mass index (BMI) of 31.0 to 31.9 in adult 05/24/2016    Past Surgical History:  Procedure Laterality Date   ABDOMINAL HYSTERECTOMY     CHOLECYSTECTOMY N/A 03/01/2022   Procedure: LAPAROSCOPIC CHOLECYSTECTOMY;  Surgeon: Fritzi Mandes, MD;  Location: [facility];  Service: General;  Laterality: N/A;   SIGMOIDOSCOPY     WISDOM TOOTH EXTRACTION      OB History   No obstetric history on file.      Home Medications    Prior to Admission medications   Medication Sig Start Date End Date Taking? Authorizing Provider  losartan (COZAAR) 100 MG tablet Take 1 tablet  by mouth daily. 02/11/22  Yes [provider]  metoprolol succinate (TOPROL-XL) 25 MG 24 hr tablet Take 1 tablet by mouth once daily 11/21/19  Yes [place], Betty G, MD  Multiple Vitamins-Minerals (MULTIVITAMIN ADULT PO) Take by mouth daily.   Yes [provider]  amLODipine-benazepril (LOTREL) 5-20 MG capsule Take 1 capsule by mouth daily. Patient not taking: Reported on 12/11/2021 06/25/19   [place], Betty G, MD  atorvastatin (LIPITOR) 40 MG tablet Take 1 tablet by mouth once daily 06/12/18   [place], Betty  G, MD  Cholecalciferol (VITAMIN D3) 50000 units TABS Take by mouth once a week.    [provider]  HYDROcodone-acetaminophen (NORCO/VICODIN) 5-325 MG tablet Take 1 tablet by mouth every 6 (six) hours as needed for severe pain. 03/01/22   Fritzi Mandes, MD  lidocaine (LIDODERM) 5 % Place 1 patch onto the skin daily. Remove & Discard patch within 12 hours or as directed by MD 11/23/21   Pricilla Loveless, MD  Omega-3 Fatty Acids (OMEGA-3 CF PO) Take by mouth.    [provider]  pantoprazole (PROTONIX) 40 MG tablet Take 1 tablet (40 mg total) by mouth daily. Patient not taking: Reported on 12/11/2021 03/27/19   [place], Betty G, MD  potassium chloride SA (KLOR-CON M) 20 MEQ tablet Take 2 tablets (40 mEq total) by mouth 2 (two) times daily for 2 doses. 03/01/22 03/02/22  Fritzi Mandes, MD  triamterene-hydrochlorothiazide (DYAZIDE) 37.5-25 MG capsule Take 1 capsule by mouth daily.    [provider]    Family History Family History  Problem Relation Age of Onset   Stroke Mother    Hypertension Mother    Cancer Father    Lung cancer Father    Cancer Sister    Goiter Sister    Hypertension Maternal Grandmother    Hypertension Maternal Grandfather    Stroke Brother    Alcohol abuse Brother    Hypertension Brother    Stroke Brother    Colon cancer Neg Hx    Esophageal cancer Neg Hx    Rectal cancer Neg Hx    Stomach cancer Neg Hx    Breast cancer Neg Hx     Social History Social History   Tobacco Use   Smoking status: Never   Smokeless tobacco: Never  Vaping Use   Vaping status: Never Used  Substance Use Topics   Alcohol use: Yes    Comment: Occ   Drug use: No     Allergies   Penicillins   Review of Systems Review of Systems  Constitutional:  Negative for fever.  HENT:  Negative for ear pain and hearing loss.   Eyes:  Negative for pain.  Respiratory:  Negative for shortness of breath.   Cardiovascular:  Negative for chest pain.   Gastrointestinal:  Negative for nausea.  Musculoskeletal:  Positive for neck pain.  Neurological:  Positive for headaches and paresthesias. Negative for weakness and loss of balance.     Physical Exam Triage Vital Signs ED Triage Vitals  Encounter Vitals Group     BP 03/26/23 1227 (!) 205/120     Systolic BP Percentile --      Diastolic BP Percentile --      Pulse Rate 03/26/23 1227 71     Resp 03/26/23 1227 20     Temp 03/26/23 1236 98.3 F (36.8 C)     Temp Source 03/26/23 1236 Oral     SpO2 03/26/23 1227 98 %  Weight 03/26/23 1235 160 lb (72.6 kg)     Height 03/26/23 1235 5\' 1"  (1.549 m)     Head Circumference --      Peak Flow --      Pain Score --      Pain Loc --      Pain Education --      Exclude from Growth Chart --    No data found.  Updated Vital Signs BP (!) 176/114 Comment: left arm  Pulse 71   Temp 98.3 F (36.8 C) (Oral)   Resp 20   Ht 5\' 1"  (1.549 m)   Wt 72.6 kg   SpO2 98%   BMI 30.23 kg/m   Visual Acuity Right Eye Distance:   Left Eye Distance:   Bilateral Distance:    Right Eye Near:   Left Eye Near:    Bilateral Near:     Physical Exam   UC Treatments / Results  Labs (all labs ordered are listed, but only abnormal results are displayed) Labs Reviewed - No data to display  EKG   Radiology No results found.  Procedures Procedures (including critical care time)  Medications Ordered in UC Medications  aspirin chewable tablet 162 mg (has no administration in time range)    Initial Impression / Assessment and Plan / UC Course  I have reviewed the triage vital signs and the nursing notes.  Pertinent labs & imaging results that were available during my care of the patient were reviewed by me and considered in my medical decision making (see chart for details).  Clinical Course as of 03/26/23 1309  Sat Mar 26, 2023  1304 Hypertensive crisis  She is compliant with her home meds Need lab work and BP med adjustment Given  her left arm pain with headache, I recommended ED evaluation and treatment She agreed with the plan [KE]  1305 Headache - may be related to her severe uncontrolled HTN ED eval for neuro-imaging recommended She agreed with the plan Transfer to the ED [KE]  1306 Left arm pain  Though no chest pain will want to r/o ACS given severe HTN ED eval recommended Two tab of ASA 81 mg given during this visit pending ED eval Proceed to the ED for further eval and treatment  [KE]    Clinical Course User Index [KE] Doreene Eland, MD   Final Clinical Impressions(s) / UC Diagnoses   Final diagnoses:  Secondary hypertension  Nonintractable headache, unspecified chronicity pattern, unspecified headache type  Severe hypertension  Left arm pain     Discharge Instructions      It was nice seeing you today. I am sorry about your high BP and headache. Due to this in addition to left arm pain, I want you to get evaluated in the ED. We will get [facility] to transport you to the ED for further evaluation and treatment.       ED Prescriptions   None    PDMP not reviewed this encounter.   Doreene Eland, MD 03/26/23 1309

## 2023-03-26 NOTE — ED Provider Notes (Signed)
[place] EMERGENCY DEPARTMENT AT [facility] Provider Note   CSN: [id number] Arrival date & time: 03/26/23  1333     History  Chief Complaint  Patient presents with   Hypertension    Latasha Young is a 72 y.o. female with past medical history significant for hypertension, hyperlipidemia, obesity, GERD presents to the ED from urgent care with concerns of bilateral arm pain, headache, and hypertension.  She reports her blood pressure has been elevated for the past couple of weeks.  She has been traveling a lot and has had to attend funerals, which may be causing increased stress.  Patient reports her left arm has also been hurting "for awhile", but it became much worse over the past week.  She was concerned that she could not lift her arm due to pain.  Denies numbness or weakness in the arm.  Her right arm starting having some pain in it which prompted the visit to urgent care.  Patient reports compliance with all medications.  She has history of migraines, but has not had a migraine headache in a long time.  Denies chest pain, shortness of breath, lightheadedness, dizziness, syncope, weakness or numbness, facial droop, speech difficulty.         Home Medications Prior to Admission medications   Medication Sig Start Date End Date Taking? Authorizing Provider  amLODipine-benazepril (LOTREL) 5-20 MG capsule Take 1 capsule by mouth daily. Patient not taking: Reported on 12/11/2021 06/25/19   [place], Betty G, MD  atorvastatin (LIPITOR) 40 MG tablet Take 1 tablet by mouth once daily 06/12/18   [place], Betty G, MD  Cholecalciferol (VITAMIN D3) 50000 units TABS Take by mouth once a week.    [provider]  HYDROcodone-acetaminophen (NORCO/VICODIN) 5-325 MG tablet Take 1 tablet by mouth every 6 (six) hours as needed for severe pain. 03/01/22   Fritzi Mandes, MD  lidocaine (LIDODERM) 5 % Place 1 patch onto the skin daily. Remove & Discard patch within 12 hours or as directed by  MD 11/23/21   Pricilla Loveless, MD  losartan (COZAAR) 100 MG tablet Take 1 tablet by mouth daily. 02/11/22   [provider]  metoprolol succinate (TOPROL-XL) 25 MG 24 hr tablet Take 1 tablet by mouth once daily 11/21/19   [place], Betty G, MD  Multiple Vitamins-Minerals (MULTIVITAMIN ADULT PO) Take by mouth daily.    [provider]  Omega-3 Fatty Acids (OMEGA-3 CF PO) Take by mouth.    [provider]  pantoprazole (PROTONIX) 40 MG tablet Take 1 tablet (40 mg total) by mouth daily. Patient not taking: Reported on 12/11/2021 03/27/19   [place], Betty G, MD  potassium chloride SA (KLOR-CON M) 20 MEQ tablet Take 2 tablets (40 mEq total) by mouth 2 (two) times daily for 2 doses. 03/01/22 03/02/22  Fritzi Mandes, MD  triamterene-hydrochlorothiazide (DYAZIDE) 37.5-25 MG capsule Take 1 capsule by mouth daily.    [provider]      Allergies    Penicillins    Review of Systems   Review of Systems  Physical Exam Updated Vital Signs BP 136/74   Pulse (!) 59   Temp 98.1 F (36.7 C) (Oral)   Resp (!) 31   SpO2 100%  Physical Exam Vitals and nursing note reviewed.  Constitutional:      General: She is not in acute distress.    Appearance: Normal appearance. She is not ill-appearing or diaphoretic.  Neck:   Cardiovascular:     Rate  and Rhythm: Normal rate and regular rhythm.     Heart sounds: Normal heart sounds.  Pulmonary:     Effort: Pulmonary effort is normal. No tachypnea or respiratory distress.     Breath sounds: Normal breath sounds and air entry.  Musculoskeletal:     Cervical back: Normal range of motion. Muscular tenderness present. No pain with movement or spinous process tenderness. Normal range of motion.     Comments: Patient has full ROM of both upper extremities.  She has increased pain in the left shoulder with flexion and extension of the LUE.    Skin:    General: Skin is warm and dry.     Capillary Refill: Capillary refill takes less  than 2 seconds.  Neurological:     Mental Status: She is alert and oriented to person, place, and time. Mental status is at baseline.     GCS: GCS eye subscore is 4. GCS verbal subscore is 5. GCS motor subscore is 6.     Cranial Nerves: Cranial nerves 2-12 are intact.     Sensory: Sensation is intact.     Motor: Motor function is intact.     Gait: Gait is intact.     Comments: No weakness appreciated in bilateral upper extremities.  Sensation is grossly intact.   Cranial Nerves:  II: peripheral fields grossly intact III,IV, VI: ptosis not present, extra-ocular movements intact bilaterally, direct and consensual pupillary light reflexes intact bilaterally V: facial sensation, jaw opening, and bite strength equal bilaterally VII: eyebrow raise, eyelid close, smile, frown, pucker equal bilaterally VIII: hearing grossly normal bilaterally  IX,X: palate elevation and swallowing intact XI: bilateral shoulder shrug and lateral head rotation equal and strong XII: midline tongue extension   Psychiatric:        Mood and Affect: Mood normal.        Behavior: Behavior normal.     ED Results / Procedures / Treatments   Labs (all labs ordered are listed, but only abnormal results are displayed) Labs Reviewed  COMPREHENSIVE METABOLIC PANEL - Abnormal; Notable for the following components:      Result Value   Creatinine, Ser 1.03 (*)    Total Bilirubin 2.8 (*)    GFR, Estimated 57 (*)    All other components within normal limits  URINALYSIS, ROUTINE W REFLEX MICROSCOPIC - Abnormal; Notable for the following components:   Color, Urine STRAW (*)    Specific Gravity, Urine 1.003 (*)    Ketones, ur 5 (*)    All other components within normal limits  LIPASE, BLOOD  CBC  TROPONIN I (HIGH SENSITIVITY)    EKG None  Radiology DG Cervical Spine Complete Result Date: 03/26/2023 CLINICAL DATA:  Bilateral arm pain and neck pain. EXAM: CERVICAL SPINE - COMPLETE 4+ VIEW COMPARISON:  None  Available. FINDINGS: There is no evidence of cervical spine fracture or prevertebral soft tissue swelling. Alignment is normal. There is moderate multilevel degenerative disc and joint disease with moderate neuroforaminal stenosis bilaterally at C3-4 and C4-5. IMPRESSION: Moderate multilevel degenerative disc and joint disease with moderate neuroforaminal stenosis bilaterally at C3-4 and C4-5. Electronically Signed   By: Romona Curls M.D.   On: 03/26/2023 15:13    Procedures Procedures    Medications Ordered in ED Medications  metoCLOPramide (REGLAN) injection 10 mg (10 mg Intravenous Given 03/26/23 1424)    ED Course/ Medical Decision Making/ A&P  Medical Decision Making Amount and/or Complexity of Data Reviewed Labs: ordered.  Risk Prescription drug management.   This patient presents to the ED with chief complaint(s) of bilateral arm pain, headache with pertinent past medical history of hypertension, hyperlipidemia, obesity.  The complaint involves an extensive differential diagnosis and also carries with it a high risk of complications and morbidity.    The differential diagnosis includes migraine headache, degenerative disc disease, nerve impingement, ACS, arthritis   The initial plan - imaging and labs were ordered during triage process  Initial Assessment:   Exam significant for overall well-appearing patient who is not in acute distress.  She is moving all extremities appropriately and has full ROM of her cervical spine and both upper extremities without pain currently.  Mild paraspinal muscle tenderness, R > L.  No obvious injuries or gross deformities.  Strength is equal bilaterally in upper extremities.  PERRL.  EOM intact.  Cranial nerves intact.  Heart rate is normal with regular rhythm.   Independent ECG/labs interpretation:  The following labs were independently interpreted:  CBC without leukocytosis or anemia.  Metabolic panel without  major electrolyte disturbance.  Cr only mildly elevated at 1.03.  Troponin negative.  UA without infection and no significant proteinuria.    Independent visualization and interpretation of imaging: I independently visualized the following imaging with scope of interpretation limited to determining acute life threatening conditions related to emergency care: cervical spine x-ray, which revealed moderate multilevel degenerative disc and joint disease with moderate neuro foraminal stenosis bilaterally.  This may explain patient's bilateral arm pain.   Treatment and Reassessment: Patient was given 10 mg of Reglan with complete resolution of her headache.  She is also not currently having any pain in her arms.    Patient's BP has also gradually improved during ED stay.   Disposition:   Patient's workup is overall reassuring.  She is currently pain free.  Suspect her arm pain may be related to degenerative changes in cervical spine.  Very low suspicion for ACS given negative troponin.  Patient has been having these symptoms for some time.  She has PCP follow up already scheduled and plans to discuss medication optimization for BP control at that time.   The patient has been appropriately medically screened and/or stabilized in the ED. I have low suspicion for any other emergent medical condition which would require further screening, evaluation or treatment in the ED or require inpatient management. At time of discharge the patient is hemodynamically stable and in no acute distress. I have discussed work-up results and diagnosis with patient and answered all questions. Patient is agreeable with discharge plan. We discussed strict return precautions for returning to the emergency department and they verbalized understanding.           Final Clinical Impression(s) / ED Diagnoses Final diagnoses:  Hypertension, unspecified type  Bilateral arm pain  Bad headache    Rx / DC Orders ED Discharge  Orders     None         Lenard Simmer, PA-C 03/26/23 1928    Rolan Bucco, MD 03/26/23 2321

## 2023-03-26 NOTE — Discharge Instructions (Addendum)
Thank you for allowing [place] to be a part of your care today.  You were evaluated in the ED for headache, high blood pressure, and arm pain.  Your workup today is overall reassuring and it does not appear that you are having a heart attack.  Your arm pain may be coming from degenerative changes in your neck.    Follow up with your PCP as scheduled and continue to monitor your blood pressure at home.    Take Tylenol as needed for arm pain, headache, or neck pain.   Return to the ED if you develop sudden worsening of your symptoms or if you have new concerns.

## 2023-03-26 NOTE — ED Notes (Signed)
Patient is being discharged from the Urgent Care and sent to the Emergency Department via [facility] . Per Provider, patient is in need of higher level of care due to HTN. Patient is aware and verbalizes understanding of plan of care.  Vitals:   03/26/23 1238 03/26/23 1255  BP: (!) 180/106 (!) 176/114  Pulse:    Resp:    Temp:    SpO2:

## 2023-03-26 NOTE — ED Provider Triage Note (Signed)
Emergency Medicine Provider Triage Evaluation Note  Latasha Young , a 72 y.o. female  was evaluated in triage.  Pt complains of arm pain, headache, abdominal heaviness.  Left arm pain has been present for now for several days, yesterday she started noticing some right arm pain.  She has paraspinal neck pain.  She also has had elevated blood pressure for the last several months.  This morning she woke up with a headache, which was unusual.  She is suspect the headache was because she was worried about her arm pain all night and did not sleep well.  She has no associated neurologic symptoms.  She saw urgent care, they advised ER visit.  Review of Systems  Positive: Headache, arm pain Negative: Neurodeficit such as one-sided weakness, numbness, vision change  Physical Exam  BP (!) 190/101   Pulse 65   Temp 98.1 F (36.7 C)   Resp 19   SpO2 99%  Gen:   Awake, no distress   Resp:  Normal effort  MSK:   Moves extremities without difficulty  Other:  Cranial nerves II through XII intact  Medical Decision Making  Medically screening exam initiated at 2:35 PM.  Appropriate orders placed.  PETRONILA HARES was informed that the remainder of the evaluation will be completed by another provider, this initial triage assessment does not replace that evaluation, and the importance of remaining in the ED until their evaluation is complete.  Headache appears to be unrelated to the hypertension.  Patient has had accelerated hypertension for the last several days.  Arm pain appears to be not ACS.  However, we will get troponins x 2 to rule out ACS.  Will give her Reglan for her headache.  CT head not indicated at this time.  Arm pain appears to be more functional.  Abdominal pain also appears to be functional and is not patient's primary concern.  She is supposed to follow-up with GI later next month.  She has a PCP, who she will try to follow-up in the next few days as well for BP medication optimization.    Derwood Kaplan, MD 03/26/23 [phone]

## 2023-03-26 NOTE — ED Triage Notes (Signed)
Urgent Care Note:  " Patient arrives with complaints of elevated blood pressure readings x2 weeks. She is compliant with her BP meds. Patient also reports headache.    Reports bilateral arm pain, diarrhea/abdominal discomfort, and recovering from a cold. Reports consistent pain in left arm over 1 week. "  Pt arrives to ED with some complaint issues from UC with medic

## 2023-03-26 NOTE — Discharge Instructions (Addendum)
It was nice seeing you today. I am sorry about your high BP and headache. Due to this in addition to left arm pain, I want you to get evaluated in the ED. We will get [facility] to transport you to the ED for further evaluation and treatment.

## 2023-03-26 NOTE — ED Notes (Signed)
Patient is being discharged from the Urgent Care and sent to the Emergency Department via [facility] . Per Dr. Lum Babe, patient is in need of higher level of care due to elevated blood pressure. Patient is aware and verbalizes understanding of plan of care.  Vitals:   03/26/23 1238 03/26/23 1255  BP: (!) 180/106 (!) 176/114  Pulse:    Resp:    Temp:    SpO2:

## 2023-03-26 NOTE — ED Triage Notes (Signed)
Patient arrives with complaints of elevated blood pressure readings x2 weeks. She is compliant with her BP meds. Patient also reports headache.   Reports bilateral arm pain, diarrhea/abdominal discomfort, and recovering from a cold. Reports consistent pain in left arm over 1 week.

## 2023-04-01 DIAGNOSIS — I10 Essential (primary) hypertension: Secondary | ICD-10-CM | POA: Diagnosis not present

## 2023-04-01 DIAGNOSIS — E785 Hyperlipidemia, unspecified: Secondary | ICD-10-CM | POA: Diagnosis not present

## 2023-04-01 DIAGNOSIS — E041 Nontoxic single thyroid nodule: Secondary | ICD-10-CM | POA: Diagnosis not present

## 2023-04-01 DIAGNOSIS — Z131 Encounter for screening for diabetes mellitus: Secondary | ICD-10-CM | POA: Diagnosis not present

## 2023-04-05 DIAGNOSIS — N183 Chronic kidney disease, stage 3 unspecified: Secondary | ICD-10-CM | POA: Diagnosis not present

## 2023-04-05 DIAGNOSIS — R109 Unspecified abdominal pain: Secondary | ICD-10-CM | POA: Diagnosis not present

## 2023-04-05 DIAGNOSIS — I10 Essential (primary) hypertension: Secondary | ICD-10-CM | POA: Diagnosis not present

## 2023-04-05 DIAGNOSIS — F419 Anxiety disorder, unspecified: Secondary | ICD-10-CM | POA: Diagnosis not present

## 2023-04-08 ENCOUNTER — Telehealth: Payer: Self-pay

## 2023-04-08 NOTE — Progress Notes (Signed)
 Transition Care Management Unsuccessful Follow-up Telephone Call  Date of discharge and from where:  [facility] 12/[facility]  Attempts:  2nd Attempt  Reason for unsuccessful TCM follow-up call:  No answer/busy   Jon Colt [place]  [facility], [facility] Guide, Phone: [phone] Website: [email or web address]

## 2023-04-08 NOTE — Progress Notes (Signed)
 Transition Care Management Unsuccessful Follow-up Telephone Call  Date of discharge and from where:  [facility]  Attempts:  1st Attempt  Reason for unsuccessful TCM follow-up call:  No answer/busy   Jon Colt [place]  [facility], [facility] Guide, Phone: [phone] Website: [email or web address]

## 2023-04-19 ENCOUNTER — Other Ambulatory Visit: Payer: Self-pay | Admitting: Internal Medicine

## 2023-04-19 DIAGNOSIS — Z1231 Encounter for screening mammogram for malignant neoplasm of breast: Secondary | ICD-10-CM

## 2023-04-22 DIAGNOSIS — Z13820 Encounter for screening for osteoporosis: Secondary | ICD-10-CM | POA: Diagnosis not present

## 2023-04-22 DIAGNOSIS — M5030 Other cervical disc degeneration, unspecified cervical region: Secondary | ICD-10-CM | POA: Diagnosis not present

## 2023-04-22 DIAGNOSIS — Z9189 Other specified personal risk factors, not elsewhere classified: Secondary | ICD-10-CM | POA: Diagnosis not present

## 2023-04-22 DIAGNOSIS — I10 Essential (primary) hypertension: Secondary | ICD-10-CM | POA: Diagnosis not present

## 2023-04-28 DIAGNOSIS — K5730 Diverticulosis of large intestine without perforation or abscess without bleeding: Secondary | ICD-10-CM | POA: Diagnosis not present

## 2023-04-28 DIAGNOSIS — Z860100 Personal history of colon polyps, unspecified: Secondary | ICD-10-CM | POA: Diagnosis not present

## 2023-04-28 DIAGNOSIS — Z1211 Encounter for screening for malignant neoplasm of colon: Secondary | ICD-10-CM | POA: Diagnosis not present

## 2023-06-09 ENCOUNTER — Ambulatory Visit: Payer: Medicare Other | Admitting: Endocrinology

## 2023-06-09 ENCOUNTER — Encounter: Payer: Self-pay | Admitting: Endocrinology

## 2023-06-09 VITALS — BP 124/70 | HR 61 | Ht 61.0 in | Wt 164.0 lb

## 2023-06-09 DIAGNOSIS — E042 Nontoxic multinodular goiter: Secondary | ICD-10-CM

## 2023-06-09 NOTE — Progress Notes (Signed)
 Outpatient Endocrinology Note [place] Read Bonelli, MD   Patient's Name: Latasha Young    DOB: 1951-01-13    MRN: [id number]  REASON OF VISIT: Follow-up for thyroid nodules   PCP: [place], MD  HISTORY OF PRESENT ILLNESS:   Latasha Young is a 73 y.o. old female with past medical history as listed below is presented for a follow up for thyroid nodules.  Pertinent Thyroid History:  Patient was previously seen by Dr. Everardo All and was last time seen in December 2022.  Patient was initially found to have goiter on physical exam and subsequently had ultrasound thyroid in September 2018, showed multinodular goiter right thyroid nodule measuring 5.5 x 4.3 x 3.4 cm solid, second right thyroid nodule posterior inferior measuring 2.1 x 1.7 x 1.6 cm.  Left inferior thyroid nodule measuring about 2.5 x 1.8 x 1.7 cm.  Patient underwent needle biopsy in October 2018 of right dominant thyroid nodule with cytology benign follicular nodule Bethesda category II and left thyroid nodule with cytology scant follicular epithelium Bethesda category I. -Patient had serial monitoring of thyroid nodules with ultrasound in October 2019, October 2020, December 2022, October 2023.  Overall bilateral thyroid nodules have remained relatively stable.  -She is euthyroid not on thyroid medication. -She has occasional neck discomfort with larger bolus of food otherwise no neck compressive symptoms. -No known family history of thyroid cancer.  No radiation exposure to head and neck.  Interval history  Patient presented for the follow-up of multiple thyroid nodules.  She has occasional neck discomfort.  She is overall feeling good.  No hypo and hyperthyroid symptoms.  No other complaints today.  REVIEW OF SYSTEMS:  As per history of present illness.   PAST MEDICAL HISTORY: Past Medical History:  Diagnosis Date   Allergy    Cataract    "beginnings of"   GERD (gastroesophageal reflux disease)    Hyperlipidemia     Hypertension    Thyroid disease    2 benign nodules noted- check yearly   Vitamin D deficiency     PAST SURGICAL HISTORY: Past Surgical History:  Procedure Laterality Date   ABDOMINAL HYSTERECTOMY     CHOLECYSTECTOMY N/A 03/01/2022   Procedure: LAPAROSCOPIC CHOLECYSTECTOMY;  Surgeon: Fritzi Mandes, MD;  Location: [facility];  Service: General;  Laterality: N/A;   SIGMOIDOSCOPY     WISDOM TOOTH EXTRACTION      ALLERGIES: Allergies  Allergen Reactions   Penicillins Hives and Other (See Comments)    FAMILY HISTORY:  Family History  Problem Relation Age of Onset   Stroke Mother    Hypertension Mother    Cancer Father    Lung cancer Father    Cancer Sister    Goiter Sister    Hypertension Maternal Grandmother    Hypertension Maternal Grandfather    Stroke Brother    Alcohol abuse Brother    Hypertension Brother    Stroke Brother    Colon cancer Neg Hx    Esophageal cancer Neg Hx    Rectal cancer Neg Hx    Stomach cancer Neg Hx    Breast cancer Neg Hx     SOCIAL HISTORY: Social History   Socioeconomic History   Marital status: Divorced    Spouse name: Not on file   Number of children: Not on file   Years of education: Not on file   Highest education level: Not on file  Occupational History   Not on file  Tobacco Use  Smoking status: Never   Smokeless tobacco: Never  Vaping Use   Vaping status: Never Used  Substance and Sexual Activity   Alcohol use: Yes    Comment: Occ   Drug use: No   Sexual activity: Not on file  Other Topics Concern   Not on file  Social History Narrative   Not on file   Social Drivers of Health   Financial Resource Strain: Low Risk  (11/16/2019)   Overall Financial Resource Strain (CARDIA)    Difficulty of Paying Living Expenses: Not hard at all  Food Insecurity: No Food Insecurity (11/16/2019)   Hunger Vital Sign    Worried About Running Out of Food in the Last Year: Never true    Ran Out of Food in the Last Year: Never true   Transportation Needs: No Transportation Needs (11/16/2019)   PRAPARE - Administrator, Civil Service (Medical): No    Lack of Transportation (Non-Medical): No  Physical Activity: Insufficiently Active (11/16/2019)   Exercise Vital Sign    Days of Exercise per Week: 5 days    Minutes of Exercise per Session: 20 min  Stress: No Stress Concern Present (11/16/2019)   [employer] of Occupational Health - Occupational Stress Questionnaire    Feeling of Stress : Not at all  Social Connections: Socially Integrated (11/16/2019)   Social Connection and Isolation Panel [NHANES]    Frequency of Communication with Friends and Family: More than three times a week    Frequency of Social Gatherings with Friends and Family: More than three times a week    Attends Religious Services: More than 4 times per year    Active Member of [employer] or Organizations: Yes    Attends Engineer, structural: More than 4 times per year    Marital Status: Living with partner    MEDICATIONS:  Current Outpatient Medications  Medication Sig Dispense Refill   amLODipine (NORVASC) 10 MG tablet Take 1 tablet every day by oral route for 100 days.     atorvastatin (LIPITOR) 40 MG tablet Take 1 tablet by mouth once daily 90 tablet 0   Cholecalciferol (VITAMIN D3) 50000 units TABS Take by mouth once a week.     losartan (COZAAR) 100 MG tablet Take 1 tablet by mouth daily.     methocarbamol (ROBAXIN) 500 MG tablet Take 500 mg by mouth 4 (four) times daily.     metoprolol succinate (TOPROL-XL) 50 MG 24 hr tablet Take 50 mg by mouth daily. Take with or immediately following a meal.     No current facility-administered medications for this visit.    PHYSICAL EXAM: Vitals:   06/09/23 1452  BP: 124/70  Pulse: 61  SpO2: 99%  Weight: 164 lb (74.4 kg)  Height: 5\' 1"  (1.549 m)   Body mass index is 30.99 kg/m.  Wt Readings from Last 3 Encounters:  06/09/23 164 lb (74.4 kg)  03/26/23 160 lb (72.6 kg)   03/01/22 155 lb (70.3 kg)     General: Well developed, well nourished female in no apparent distress.  HEENT: AT/[place], no external lesions. Hearing intact to the spoken word Eyes:  Conjunctiva clear and no icterus. Neck: Trachea midline, neck supple with mild appreciable thyromegaly or no lymphadenopathy and b/l palpable thyroid nodules + Lungs: Clear to auscultation, no wheeze. Respirations not labored Heart: S1S2, Regular in rate and rhythm. No loud murmurs Abdomen: Soft, non tender, non distended, no masses, no striae Neurologic: Alert, oriented, normal speech, deep tendon  biceps reflexes normal,  no gross focal neurological deficit Extremities: No pedal pitting edema, no tremors of outstretched hands Skin: Warm, color good. Psychiatric: Does not appear depressed or anxious  PERTINENT HISTORIC LABORATORY AND IMAGING STUDIES:  All pertinent laboratory results were reviewed. Please see HPI also for further details.   TSH  Date Value Ref Range Status  03/11/2021 1.34 0.35 - 5.50 uIU/mL Final  03/10/2020 0.97 0.35 - 4.50 uIU/mL Final  01/26/2019 1.08 0.35 - 4.50 uIU/mL Final     ASSESSMENT / PLAN  1. Multiple thyroid nodules    -Patient is known to have bilateral thyroid nodules since September 2018, right dominant thyroid nodule measuring 7 cm status post FNA with benign cytology in 2018.  She has left inferior thyroid nodule measuring 3 cm status post FNA in 2018 with scanty follicular epithelium. -Patient has multiple ultrasound thyroid to monitor thyroid nodules, most recent ultrasound thyroid was in October 2023 with relatively stable bilateral thyroid nodules. -Patient is euthyroid, not on thyroid medication.  She has occasional neck discomfort on swallowing with larger bolus of food.  Plan: -Will check ultrasound thyroid this year to monitor thyroid nodules. -Check thyroid function test today. -Will continue to monitor these nodules with serial ultrasound, if the ultrasound  findings are stable this time, we will increase the interval of ultrasound thyroid.  Emonnie was seen today for establish care.  Diagnoses and all orders for this visit:  Multiple thyroid nodules -     US THYROID; Future -     T4, free -     TSH    DISPOSITION Follow up in clinic in 12 months suggested.  All questions answered and patient verbalized understanding of the plan.  [place] Zuleica Seith, MD [facility] Endocrinology [facility] [address] [place], [address] [place], [place] [address] Phone # [phone]  At least part of this note was generated using voice recognition software. Inadvertent word errors may have occurred, which were not recognized during the proofreading process.

## 2023-06-10 ENCOUNTER — Ambulatory Visit
Admission: RE | Admit: 2023-06-10 | Discharge: 2023-06-10 | Disposition: A | Discharge status: Home / Self Care | Source: Ambulatory Visit | Attending: Endocrinology | Admitting: Endocrinology

## 2023-06-10 ENCOUNTER — Encounter: Payer: Self-pay | Admitting: Endocrinology

## 2023-06-10 DIAGNOSIS — E042 Nontoxic multinodular goiter: Secondary | ICD-10-CM

## 2023-06-10 LAB — TSH: TSH: 0.88 m[IU]/L (ref 0.40–4.50)

## 2023-06-10 LAB — T4, FREE: Free T4: 0.9 ng/dL (ref 0.8–1.8)

## 2023-06-13 ENCOUNTER — Ambulatory Visit
Admission: RE | Admit: 2023-06-13 | Discharge: 2023-06-13 | Disposition: A | Discharge status: Home / Self Care | Payer: Medicare Other | Source: Ambulatory Visit | Attending: Internal Medicine | Admitting: Internal Medicine

## 2023-06-13 ENCOUNTER — Encounter: Payer: Self-pay | Admitting: Endocrinology

## 2023-06-13 DIAGNOSIS — Z1231 Encounter for screening mammogram for malignant neoplasm of breast: Secondary | ICD-10-CM

## 2023-07-01 ENCOUNTER — Other Ambulatory Visit: Payer: Self-pay | Admitting: Internal Medicine

## 2023-07-01 DIAGNOSIS — Z23 Encounter for immunization: Secondary | ICD-10-CM | POA: Diagnosis not present

## 2023-07-01 DIAGNOSIS — I10 Essential (primary) hypertension: Secondary | ICD-10-CM | POA: Diagnosis not present

## 2023-07-01 DIAGNOSIS — Z13820 Encounter for screening for osteoporosis: Secondary | ICD-10-CM | POA: Diagnosis not present

## 2023-07-01 DIAGNOSIS — N183 Chronic kidney disease, stage 3 unspecified: Secondary | ICD-10-CM | POA: Diagnosis not present

## 2023-07-01 DIAGNOSIS — M549 Dorsalgia, unspecified: Secondary | ICD-10-CM | POA: Diagnosis not present

## 2023-09-12 DIAGNOSIS — H524 Presbyopia: Secondary | ICD-10-CM | POA: Diagnosis not present

## 2023-09-27 ENCOUNTER — Ambulatory Visit
Admission: RE | Admit: 2023-09-27 | Discharge: 2023-09-27 | Disposition: A | Discharge status: Home / Self Care | Source: Ambulatory Visit | Attending: Internal Medicine | Admitting: Internal Medicine

## 2023-09-27 ENCOUNTER — Other Ambulatory Visit: Payer: Self-pay | Admitting: Internal Medicine

## 2023-09-27 DIAGNOSIS — R0789 Other chest pain: Secondary | ICD-10-CM

## 2023-09-28 DIAGNOSIS — N183 Chronic kidney disease, stage 3 unspecified: Secondary | ICD-10-CM | POA: Diagnosis not present

## 2023-09-28 DIAGNOSIS — M545 Low back pain, unspecified: Secondary | ICD-10-CM | POA: Diagnosis not present

## 2023-09-28 DIAGNOSIS — R0789 Other chest pain: Secondary | ICD-10-CM | POA: Diagnosis not present

## 2023-09-28 DIAGNOSIS — G8929 Other chronic pain: Secondary | ICD-10-CM | POA: Diagnosis not present

## 2024-01-10 DIAGNOSIS — R0789 Other chest pain: Secondary | ICD-10-CM | POA: Diagnosis not present

## 2024-01-10 DIAGNOSIS — Z13820 Encounter for screening for osteoporosis: Secondary | ICD-10-CM | POA: Diagnosis not present

## 2024-01-10 DIAGNOSIS — I10 Essential (primary) hypertension: Secondary | ICD-10-CM | POA: Diagnosis not present

## 2024-01-20 DIAGNOSIS — Z131 Encounter for screening for diabetes mellitus: Secondary | ICD-10-CM | POA: Diagnosis not present

## 2024-01-20 DIAGNOSIS — E785 Hyperlipidemia, unspecified: Secondary | ICD-10-CM | POA: Diagnosis not present

## 2024-01-20 DIAGNOSIS — R0789 Other chest pain: Secondary | ICD-10-CM | POA: Diagnosis not present

## 2024-01-27 DIAGNOSIS — R079 Chest pain, unspecified: Secondary | ICD-10-CM | POA: Diagnosis not present

## 2024-02-17 DIAGNOSIS — F419 Anxiety disorder, unspecified: Secondary | ICD-10-CM | POA: Diagnosis not present

## 2024-02-17 DIAGNOSIS — E663 Overweight: Secondary | ICD-10-CM | POA: Diagnosis not present

## 2024-02-17 DIAGNOSIS — R0789 Other chest pain: Secondary | ICD-10-CM | POA: Diagnosis not present

## 2024-02-17 DIAGNOSIS — I10 Essential (primary) hypertension: Secondary | ICD-10-CM | POA: Diagnosis not present

## 2024-02-21 ENCOUNTER — Other Ambulatory Visit: Payer: Self-pay | Admitting: Family Medicine

## 2024-02-21 ENCOUNTER — Ambulatory Visit
Admission: RE | Admit: 2024-02-21 | Discharge: 2024-02-21 | Disposition: A | Discharge status: Home / Self Care | Source: Ambulatory Visit | Attending: Family Medicine | Admitting: Family Medicine

## 2024-02-21 DIAGNOSIS — R0789 Other chest pain: Secondary | ICD-10-CM

## 2024-02-21 DIAGNOSIS — R079 Chest pain, unspecified: Secondary | ICD-10-CM | POA: Diagnosis not present

## 2024-03-06 NOTE — Progress Notes (Addendum)
 [facility]:    Date:  03/07/2024   ID:  Latasha Young, DOB 06-11-1950, MRN [id number]  PCP:  Roanna Ezekiel NOVAK, MD  Cardiologist:  None     Referring MD: Cecilia Kevin MATSU, NP   Chief Complaint: chest pain  History of Present Illness:    Latasha Young is a 73 y.o. female with a history of chest pain with normal coronaries on coronary CTA in 05/2018, hypertension, hyperlipidemia, prediabetes, thyroid  nodules, GERD, and obesity who presents today in the [facility] for further evaluation of chest pain.   Patient was previously seen by Dr. Dann. She was first seen in 04/2018 for further evaluation of chest pain. Coronary CTA was ordered and showed a coronary calcium  score of 0 with no evidence of CAD. She was seen again in 06/2020 for dyspnea on exertion. Echo was ordered and showed LVEF of 60-65% with normal wall motion and mild asymmetric LVH of the basal-septal segment, normal RV function, and no significant valvular disease. She has not been seen by Cardiology since then.  Patient was recently seen by PCP on 02/17/2024 and reported a recent episode of chest tightness, weakness, and lightheadedness. She was referred to Cardiology for further evaluation.   She is difficult historian and has difficulty elaborating on her symptoms and providing a timeline.  From the best that I can gather, she has been having a variety of symptoms for the last 3 to 4 weeks. She started having some chest discomfort about 2 weeks prior to seeing her PCP in 02/2024. However, she describes one day of more intense sharp pain in her chest, neck, bilateral arms, and abdomen that was pretty constant throughout the day.  This is really what led her to see her PCP.  It sounds like the pain that day lasted for a longer periods of time but she also describes some very atypical sharp pain in her chest that only lasts  a couple seconds.  Since this episode, she has continued to have some chest  heaviness at rest and mostly at night.  She denies any exertional chest pain but does report dyspnea on exertion for the last 3 weeks especially with activity such as vacuuming and going up the steps.  She states these symptoms are different than the ones she previously had when she saw Dr. Dann. She denies any shortness of breath at rest.  No orthopnea, PND, or edema.  No recent palpitations.  She denies any lightheadedness or dizziness to me but it sounds like she reported some of this to her PCP.  Her PCP ordered a 2-week Zio monitor.  I am unable to see the results of this but patient states it was normal.  She denies any syncope.  She continues to have some intermittent abdominal pain.  No abnormal bleeding in her stools.    She decided to hold her Losartan for 3 days after speaking with a Pharmacist at her church and states she did feeling better with lest chest heaviness during these days. Her PCP has been managing her BP and it sounds like a few changes have been made over the last year per patient report.  She denies any history of tobacco or drug use. She reports very rare alcohol use (none in the last couple of years).   She does have a family history of cardiovascular disease. She states he mother died from a heart aneurysm in her 24s. Her maternal grandmother also had some heart  disease but she does not know details about this.   EKGs/Labs/Other Studies Reviewed:    The following studies were reviewed:  Coronary CTA 05/17/2018: Impression: 1.  Normal right dominant coronary arteries 2.  Calcium  score 0 3.  Normal aortic root diameter 3.7 cm _______________  Echocardiogram 07/01/2020: Impressions: 1. Left ventricular ejection fraction, by estimation, is 60 to 65%. The  left ventricle has normal function. The left ventricle has no regional  wall motion abnormalities. There is mild asymmetric left ventricular  hypertrophy of the basal-septal segment  (13 mm) without LVOT  obstruction. Left ventricular diastolic parameters  were normal.   2. Right ventricular systolic function is normal. The right ventricular  size is normal. Tricuspid regurgitation signal is inadequate for assessing  PA pressure.   3. The mitral valve is normal in structure. Trivial mitral valve  regurgitation. No evidence of mitral stenosis.   4. The aortic valve is normal in structure. Aortic valve regurgitation is  not visualized. No aortic stenosis is present.   5. The inferior vena cava is normal in size with greater than 50%  respiratory variability, suggesting right atrial pressure of 3 mmHg.   EKG:  EKG ordered today.   EKG Interpretation Date/Time:  Wednesday March 07 2024 10:47:49 EST Ventricular Rate:  72 PR Interval:  198 QRS Duration:  68 QT Interval:  392 QTC Calculation: 429 R Axis:   10  Text Interpretation: Normal sinus rhythm Low voltage QRS Non-specific T wave changes Septal infarct (cited on or before 23-Nov-2021) Confirmed by Adali Pennings [phone]) on 03/07/2024 12:21:55 PM    Recent Labs: 03/26/2023: ALT 15; BUN 8; Creatinine, Ser 1.03; Hemoglobin 13.5; Platelets 242; Potassium 3.5; Sodium 136 06/09/2023: TSH 0.88  Recent Lipid Panel    Component Value Date/Time   CHOL 160 06/25/2019 1103   TRIG 99.0 06/25/2019 1103   HDL 47.00 06/25/2019 1103   CHOLHDL 3 06/25/2019 1103   VLDL 19.8 06/25/2019 1103   LDLCALC 94 06/25/2019 1103    Physical Exam:    Vital Signs: BP 130/80   Pulse 72   Ht 5' 1 (1.549 m)   Wt 166 lb 12.8 oz (75.7 kg)   SpO2 99%   BMI 31.52 kg/m     Wt Readings from Last 3 Encounters:  03/07/24 166 lb 12.8 oz (75.7 kg)  06/09/23 164 lb (74.4 kg)  03/26/23 160 lb (72.6 kg)     General: 73 y.o. African-American female in no acute distress. HEENT: Normocephalic and atraumatic. Sclera clear.  Neck: Supple. No carotid bruits. No JVD. Heart: RRR. Distinct S1 and S2. No murmurs, gallops, or rubs.  Lungs: No increased work of  breathing. Clear to ausculation bilaterally. No wheezes, rhonchi, or rales.  Extremities: No lower extremity edema.   Skin: Warm and dry. Neuro: No focal deficits. Psych: Normal affect. Responds appropriately.  Assessment:    1. Precordial pain   2. Dyspnea on exertion   3. Primary hypertension   4. Hyperlipidemia, unspecified hyperlipidemia type     Plan:    Chest Pain Dyspnea on Exertion Patient has a history of chest pain and dyspnea in the past. Coronary CTA in 2020 showed a coronary calcium  score of 0 with no evidence of CAD. Echo in 2022 showed normal LV function with no significant valvular disease. She was referred back to Cardiology today for recent chest pain and dyspnea on exertion.  - EKG in the office shows normal sinus rhythm with no acute ischemic changes. - Patient is  a difficult historian has difficulty elaborating on her symptoms and providing a timeline. Most of her chest pain sounds atypical. However, she did have one severe episode with radiation to her neck and bilateral arms. Dyspnea on exertion could also be an anginal equivalent. Will order a coronary CTA and Echo for further evaluation. Will provide a one time dose of Lopressor  100mg  for patient to take 2 hours prior to scan (she usually takes Toprol -XL so advised patient to take the Lopressor  not the Toprol -XL on day of CTA). Will check BMET today.  Hypertension BP well controlled. - Continue current medications: Amlodipine  10mg  daily, Losartan 100mg  daily, and Toprol -XL 50mg  daily. - She did mention that she held Losartan for 3 days and felt better with this. Her PCP has been managing her hypertension and it sounds like a couple of changes have been made over the last year or so. Therefore, recommended she discuss this with her PCP.  Hyperlipidemia Lipid panel in 01/2024: Total Cholesterol 137, Triglycerides 102, HDL 43, LDL 75. - Continue Lipitor 40mg  daily.  Of note, patient states PCP recently ordered a 2  week cardiac monitor (it looks like this was likely ordered for lightheadedness but patient denies having lightheadedness to me). Will request results of this be faxed to us .  Disposition: Follow up in 2 months.    Signed, Aline FORBES Door, PA-C  03/07/2024 1:00 PM    [facility]  ADDENDUM 03/20/2024 Reviewed results from Zio monitor that PCP ordered. Monitor showed sinus rhythm (average heart rate of 64 bpm) with 1st degree AV block and rare PACs but no significant arrhythmias. No new recommendations.  Coronary CTA and Echo still pending at this time.  Shasta Chinn E Annaston Upham, PA-C 03/20/2024 4:31 PM

## 2024-03-07 ENCOUNTER — Ambulatory Visit: Attending: Student | Admitting: Student

## 2024-03-07 ENCOUNTER — Encounter: Payer: Self-pay | Admitting: Student

## 2024-03-07 VITALS — BP 130/80 | HR 72 | Ht 61.0 in | Wt 166.8 lb

## 2024-03-07 DIAGNOSIS — I10 Essential (primary) hypertension: Secondary | ICD-10-CM

## 2024-03-07 DIAGNOSIS — E785 Hyperlipidemia, unspecified: Secondary | ICD-10-CM

## 2024-03-07 DIAGNOSIS — R0609 Other forms of dyspnea: Secondary | ICD-10-CM

## 2024-03-07 DIAGNOSIS — R072 Precordial pain: Secondary | ICD-10-CM

## 2024-03-07 DIAGNOSIS — R079 Chest pain, unspecified: Secondary | ICD-10-CM | POA: Diagnosis not present

## 2024-03-07 MED ORDER — METOPROLOL TARTRATE 100 MG PO TABS: 100.0000 mg | ORAL_TABLET | Freq: Once | ORAL | 0 refills | Status: AC

## 2024-03-07 NOTE — Patient Instructions (Signed)
 Medication Instructions:  Your physician recommends that you continue on your current medications as directed. Please refer to the Current Medication list given to you today.  *If you need a refill on your cardiac medications before your next appointment, please call your pharmacy*  Lab Work: TODAY:  BMET  If you have labs (blood work) drawn today and your tests are completely normal, you will receive your results only by: MyChart Message (if you have MyChart) OR A paper copy in the mail If you have any lab test that is abnormal or we need to change your treatment, we will call you to review the results.  Testing/Procedures: Your physician has requested that you have an echocardiogram. Echocardiography is a painless test that uses sound waves to create images of your heart. It provides your doctor with information about the size and shape of your heart and how well your heart's chambers and valves are working. This procedure takes approximately one hour. There are no restrictions for this procedure. Please do NOT wear cologne, perfume, aftershave, or lotions (deodorant is allowed). Please arrive 15 minutes prior to your appointment time.  Please note: We ask at that you not bring children with you during ultrasound (echo/ vascular) testing. Due to room size and safety concerns, children are not allowed in the ultrasound rooms during exams. Our front office staff cannot provide observation of children in our lobby area while testing is being conducted. An adult accompanying a patient to their appointment will only be allowed in the ultrasound room at the discretion of the ultrasound technician under special circumstances. We apologize for any inconvenience.   Your physician has requested that you have cardiac CT. Cardiac computed tomography (CT) is a painless test that uses an x-ray machine to take clear, detailed pictures of your heart. For further information please visit [email or web address]  Please follow instruction sheet BELOW:    Your cardiac CT will be scheduled at one of the below locations:   [facility] [address] [place], [place] [address] [phone] (Severe contrast allergies only)  OR   [facility] [address] [place], [place] [address] [phone]  OR   [facility] [address] [place], [place] [address] [phone]  OR   [facility] [address]  [place], [place] [address]  OR   [facility] [address] [place], [place] [phone]  If scheduled at [facility], please arrive at the [facility] (Entrance C2) of [facility] 30 minutes prior to test start time. You can use the FREE valet parking offered at entrance C (encouraged to control the heart rate for the test)  Proceed to the [facility] Radiology Department (first floor) to check-in and test prep.  All radiology patients and guests should use entrance C2 at [facility], accessed from [facility], even though the hospital's physical address listed is [address].  If scheduled at the Heart and Vascular Tower at [address], please enter the parking lot using the [address] entrance and use the FREE valet service at the patient drop-off area. Enter the building and check-in with registration on the main floor.  If scheduled at [facility], please arrive to the Heart and Vascular Center 15 mins early for check-in and test prep.  There is spacious parking and easy access to the radiology department from the [facility] and Vascular entrance.  Please enter here and check-in with the desk attendant.   If scheduled at [facility], please arrive 30 minutes early for check-in and test prep.  Please follow these instructions carefully (unless otherwise directed):  An IV will be required  for this test and Nitroglycerin  will be given.     On the Night Before the Test: Be sure to Drink plenty of water. Do not consume any caffeinated/decaffeinated beverages or chocolate 12 hours prior to your test. Do not take any antihistamines 12 hours prior to your test.    On the Day of the Test: Drink plenty of water until 1 hour prior to the test. Do not eat any food 1 hour prior to test. HOLD THE METOPROLOL  XL 50 MG ON THE DAY OF THE CARDIAC CT.  Take metoprolol  (Lopressor ) 100 MG two hours prior to test.  THIS HAS BEEN SENT TO [facility]- please wear underwire-free bra if available, avoid dresses & tight clothing   After the Test: Drink plenty of water. After receiving IV contrast, you may experience a mild flushed feeling. This is normal. On occasion, you may experience a mild rash up to 24 hours after the test. This is not dangerous. If this occurs, you can take Benadryl 25 mg, Zyrtec, Claritin, or Allegra and increase your fluid intake. (Patients taking Tikosyn should avoid Benadryl, and may take Zyrtec, Claritin, or Allegra) If you experience trouble breathing, this can be serious. If it is severe call 911 IMMEDIATELY. If it is mild, please call our office.  We will call to schedule your test 2-4 weeks out understanding that some insurance companies will need an authorization prior to the service being performed.   For more information and frequently asked questions, please visit our website : [email or web address]  For non-scheduling related questions, please contact the cardiac imaging nurse navigator should you have any questions/concerns: Cardiac Imaging Nurse Navigators Direct Office Dial: [phone]   For scheduling needs, including cancellations and rescheduling, please call Brittany, [phone].   Follow-Up: At [facility], you and your health needs are our priority.  As part of our continuing mission to provide you with exceptional  heart care, our providers are all part of one team.  This team includes your primary Cardiologist (physician) and Advanced Practice Providers or APPs (Physician Assistants and Nurse Practitioners) who all work together to provide you with the care you need, when you need it.  Your next appointment:   2 month(s)  Provider:   Callie Goodrich, PA-C          We recommend signing up for the patient portal called MyChart.  Sign up information is provided on this After Visit Summary.  MyChart is used to connect with patients for Virtual Visits (Telemedicine).  Patients are able to view lab/test results, encounter notes, upcoming appointments, etc.  Non-urgent messages can be sent to your provider as well.   To learn more about what you can do with MyChart, go to [email or web address].   Other Instructions

## 2024-03-08 ENCOUNTER — Ambulatory Visit: Payer: Self-pay | Admitting: Student

## 2024-03-08 LAB — BASIC METABOLIC PANEL WITH GFR
BUN/Creatinine Ratio: 9 — ABNORMAL LOW (ref 12–28)
BUN: 9 mg/dL (ref 8–27)
CO2: 25 mmol/L (ref 20–29)
Calcium: 9.4 mg/dL (ref 8.7–10.3)
Chloride: 99 mmol/L (ref 96–106)
Creatinine, Ser: 1.02 mg/dL — ABNORMAL HIGH (ref 0.57–1.00)
Glucose: 83 mg/dL (ref 70–99)
Potassium: 4.1 mmol/L (ref 3.5–5.2)
Sodium: 137 mmol/L (ref 134–144)
eGFR: 57 mL/min/1.73 — ABNORMAL LOW (ref >59)

## 2024-03-23 ENCOUNTER — Encounter (HOSPITAL_COMMUNITY): Payer: Self-pay

## 2024-03-27 ENCOUNTER — Ambulatory Visit (HOSPITAL_COMMUNITY)
Admission: RE | Admit: 2024-03-27 | Discharge: 2024-03-27 | Disposition: A | Discharge status: Home / Self Care | Source: Ambulatory Visit | Attending: Student | Admitting: Student

## 2024-03-27 DIAGNOSIS — R072 Precordial pain: Secondary | ICD-10-CM | POA: Insufficient documentation

## 2024-03-27 MED ORDER — NITROGLYCERIN 0.4 MG SL SUBL
0.8000 mg | SUBLINGUAL_TABLET | Freq: Once | SUBLINGUAL | Status: AC
  Administered 2024-03-27: 0.8 mg via SUBLINGUAL

## 2024-03-27 MED ORDER — IOHEXOL 350 MG/ML SOLN
100.0000 mL | Freq: Once | INTRAVENOUS | Status: AC | PRN
  Administered 2024-03-27: 100 mL via INTRAVENOUS

## 2024-05-01 ENCOUNTER — Other Ambulatory Visit: Payer: Self-pay | Admitting: Internal Medicine

## 2024-05-01 DIAGNOSIS — Z1231 Encounter for screening mammogram for malignant neoplasm of breast: Secondary | ICD-10-CM

## 2024-05-09 ENCOUNTER — Telehealth (HOSPITAL_COMMUNITY): Payer: Self-pay | Admitting: Student

## 2024-05-09 NOTE — Telephone Encounter (Signed)
 Patient called and cancelled echocardiogram and did not wish to reschedule. Order will be removed from there active echo WQ. Thank you.

## 2024-05-16 ENCOUNTER — Ambulatory Visit (HOSPITAL_COMMUNITY)

## 2024-06-04 ENCOUNTER — Ambulatory Visit: Admitting: Student

## 2024-06-08 ENCOUNTER — Ambulatory Visit: Admitting: Endocrinology

## 2024-06-14 ENCOUNTER — Ambulatory Visit
